# Patient Record
Sex: Male | Born: 1945
Health system: Southern US, Community
[De-identification: ages and names within clinical notes are randomized; demographics above are authoritative.]

## PROBLEM LIST (undated history)

## (undated) DIAGNOSIS — C61 Malignant neoplasm of prostate: Secondary | ICD-10-CM

## (undated) DIAGNOSIS — I10 Essential (primary) hypertension: Secondary | ICD-10-CM

## (undated) HISTORY — DX: Essential (primary) hypertension: I10

## (undated) HISTORY — PX: PROSTATE BIOPSY: SHX241

---

## 1997-11-20 ENCOUNTER — Encounter: Admission: RE | Admit: 1997-11-20 | Discharge: 1997-11-20 | Payer: Self-pay | Admitting: Family Medicine

## 1998-02-17 ENCOUNTER — Encounter: Admission: RE | Admit: 1998-02-17 | Discharge: 1998-02-17 | Payer: Self-pay | Admitting: Family Medicine

## 1998-03-09 ENCOUNTER — Encounter: Admission: RE | Admit: 1998-03-09 | Discharge: 1998-03-09 | Payer: Self-pay | Admitting: Sports Medicine

## 1999-01-10 ENCOUNTER — Encounter: Admission: RE | Admit: 1999-01-10 | Discharge: 1999-01-10 | Payer: Self-pay | Admitting: Sports Medicine

## 1999-01-10 ENCOUNTER — Encounter: Payer: Self-pay | Admitting: Emergency Medicine

## 1999-01-10 ENCOUNTER — Emergency Department (HOSPITAL_COMMUNITY): Admission: EM | Admit: 1999-01-10 | Discharge: 1999-01-10 | Payer: Self-pay | Admitting: Emergency Medicine

## 1999-01-14 ENCOUNTER — Encounter: Admission: RE | Admit: 1999-01-14 | Discharge: 1999-01-14 | Payer: Self-pay | Admitting: Family Medicine

## 1999-01-14 ENCOUNTER — Ambulatory Visit (HOSPITAL_COMMUNITY): Admission: RE | Admit: 1999-01-14 | Discharge: 1999-01-14 | Payer: Self-pay | Admitting: Family Medicine

## 1999-01-20 ENCOUNTER — Ambulatory Visit (HOSPITAL_BASED_OUTPATIENT_CLINIC_OR_DEPARTMENT_OTHER): Admission: RE | Admit: 1999-01-20 | Discharge: 1999-01-20 | Payer: Self-pay | Admitting: Orthopedic Surgery

## 1999-05-19 ENCOUNTER — Encounter: Admission: RE | Admit: 1999-05-19 | Discharge: 1999-05-19 | Payer: Self-pay | Admitting: Sports Medicine

## 1999-05-30 ENCOUNTER — Encounter: Admission: RE | Admit: 1999-05-30 | Discharge: 1999-05-30 | Payer: Self-pay | Admitting: Sports Medicine

## 1999-11-08 ENCOUNTER — Encounter: Admission: RE | Admit: 1999-11-08 | Discharge: 1999-11-08 | Payer: Self-pay | Admitting: Family Medicine

## 1999-12-15 ENCOUNTER — Encounter: Admission: RE | Admit: 1999-12-15 | Discharge: 1999-12-15 | Payer: Self-pay | Admitting: Sports Medicine

## 2000-08-28 ENCOUNTER — Encounter: Admission: RE | Admit: 2000-08-28 | Discharge: 2000-08-28 | Payer: Self-pay | Admitting: Sports Medicine

## 2000-09-06 ENCOUNTER — Encounter: Admission: RE | Admit: 2000-09-06 | Discharge: 2000-09-06 | Payer: Self-pay | Admitting: Family Medicine

## 2001-01-30 ENCOUNTER — Encounter: Admission: RE | Admit: 2001-01-30 | Discharge: 2001-01-30 | Payer: Self-pay | Admitting: Family Medicine

## 2001-02-12 ENCOUNTER — Encounter: Admission: RE | Admit: 2001-02-12 | Discharge: 2001-02-12 | Payer: Self-pay | Admitting: Family Medicine

## 2001-07-05 ENCOUNTER — Encounter: Admission: RE | Admit: 2001-07-05 | Discharge: 2001-07-05 | Payer: Self-pay | Admitting: Family Medicine

## 2001-07-25 ENCOUNTER — Encounter: Admission: RE | Admit: 2001-07-25 | Discharge: 2001-07-25 | Payer: Self-pay | Admitting: Sports Medicine

## 2002-08-19 ENCOUNTER — Encounter: Admission: RE | Admit: 2002-08-19 | Discharge: 2002-08-19 | Payer: Self-pay | Admitting: Family Medicine

## 2002-10-21 ENCOUNTER — Encounter: Admission: RE | Admit: 2002-10-21 | Discharge: 2002-10-21 | Payer: Self-pay | Admitting: Family Medicine

## 2002-10-23 ENCOUNTER — Encounter: Admission: RE | Admit: 2002-10-23 | Discharge: 2002-10-23 | Payer: Self-pay | Admitting: Family Medicine

## 2003-02-12 ENCOUNTER — Encounter: Admission: RE | Admit: 2003-02-12 | Discharge: 2003-02-12 | Payer: Self-pay | Admitting: Sports Medicine

## 2003-04-09 ENCOUNTER — Encounter: Admission: RE | Admit: 2003-04-09 | Discharge: 2003-04-09 | Payer: Self-pay | Admitting: Family Medicine

## 2003-04-14 ENCOUNTER — Encounter: Admission: RE | Admit: 2003-04-14 | Discharge: 2003-04-14 | Payer: Self-pay | Admitting: Sports Medicine

## 2003-07-23 ENCOUNTER — Encounter: Admission: RE | Admit: 2003-07-23 | Discharge: 2003-07-23 | Payer: Self-pay | Admitting: Family Medicine

## 2003-09-02 ENCOUNTER — Encounter: Admission: RE | Admit: 2003-09-02 | Discharge: 2003-09-02 | Payer: Self-pay | Admitting: Sports Medicine

## 2004-10-11 ENCOUNTER — Ambulatory Visit: Payer: Self-pay | Admitting: Family Medicine

## 2004-10-13 ENCOUNTER — Ambulatory Visit: Payer: Self-pay | Admitting: Sports Medicine

## 2004-11-04 ENCOUNTER — Ambulatory Visit (HOSPITAL_COMMUNITY): Admission: RE | Admit: 2004-11-04 | Discharge: 2004-11-04 | Payer: Self-pay | Admitting: Sports Medicine

## 2004-11-04 ENCOUNTER — Ambulatory Visit: Payer: Self-pay | Admitting: Sports Medicine

## 2004-11-18 ENCOUNTER — Ambulatory Visit (HOSPITAL_COMMUNITY): Admission: RE | Admit: 2004-11-18 | Discharge: 2004-11-18 | Payer: Self-pay | Admitting: Gastroenterology

## 2005-02-23 ENCOUNTER — Ambulatory Visit: Payer: Self-pay | Admitting: Family Medicine

## 2005-06-14 ENCOUNTER — Ambulatory Visit: Payer: Self-pay | Admitting: Family Medicine

## 2005-06-15 ENCOUNTER — Ambulatory Visit: Payer: Self-pay | Admitting: Family Medicine

## 2005-12-14 ENCOUNTER — Ambulatory Visit: Payer: Self-pay | Admitting: Sports Medicine

## 2006-10-11 DIAGNOSIS — K5732 Diverticulitis of large intestine without perforation or abscess without bleeding: Secondary | ICD-10-CM

## 2006-10-11 DIAGNOSIS — C4490 Unspecified malignant neoplasm of skin, unspecified: Secondary | ICD-10-CM

## 2006-10-11 DIAGNOSIS — E785 Hyperlipidemia, unspecified: Secondary | ICD-10-CM

## 2006-10-11 DIAGNOSIS — I10 Essential (primary) hypertension: Secondary | ICD-10-CM | POA: Insufficient documentation

## 2006-11-19 ENCOUNTER — Telehealth: Payer: Self-pay | Admitting: Sports Medicine

## 2006-11-20 ENCOUNTER — Encounter (INDEPENDENT_AMBULATORY_CARE_PROVIDER_SITE_OTHER): Payer: Self-pay | Admitting: *Deleted

## 2006-11-22 ENCOUNTER — Ambulatory Visit: Payer: Self-pay | Admitting: Sports Medicine

## 2006-11-22 LAB — CONVERTED CEMR LAB
ALT: 26 units/L (ref 0–53)
AST: 33 units/L (ref 0–37)
Albumin: 4.2 g/dL (ref 3.5–5.2)
Alkaline Phosphatase: 97 units/L (ref 39–117)
Calcium: 9.2 mg/dL (ref 8.4–10.5)
Chloride: 105 meq/L (ref 96–112)
HCT: 40.7 %
HDL: 47 mg/dL (ref >39)
LDL Cholesterol: 100 mg/dL — ABNORMAL HIGH (ref 0–99)
PSA: 1.42 ng/mL (ref 0.10–4.00)
Potassium: 4.1 meq/L (ref 3.5–5.3)
RBC: 4.47 M/uL
Sodium: 142 meq/L (ref 135–145)
Total Protein: 6.9 g/dL (ref 6.0–8.3)

## 2006-12-13 ENCOUNTER — Ambulatory Visit: Payer: Self-pay | Admitting: Sports Medicine

## 2007-02-18 ENCOUNTER — Telehealth: Payer: Self-pay | Admitting: *Deleted

## 2007-11-26 ENCOUNTER — Telehealth: Payer: Self-pay | Admitting: Sports Medicine

## 2007-12-16 ENCOUNTER — Ambulatory Visit: Payer: Self-pay | Admitting: Sports Medicine

## 2007-12-16 LAB — CONVERTED CEMR LAB
AST: 36 units/L (ref 0–37)
Albumin: 4.2 g/dL (ref 3.5–5.2)
BUN: 17 mg/dL (ref 6–23)
CO2: 23 meq/L (ref 19–32)
Calcium: 9 mg/dL (ref 8.4–10.5)
Chloride: 105 meq/L (ref 96–112)
Cholesterol: 200 mg/dL (ref 0–200)
HCT: 41.8 % (ref 39.0–52.0)
HDL: 53 mg/dL (ref >39)
Hemoglobin: 14.4 g/dL (ref 13.0–17.0)
PSA: 1.58 ng/mL (ref 0.10–4.00)
Potassium: 4.5 meq/L (ref 3.5–5.3)
RBC: 4.69 M/uL (ref 4.22–5.81)
RDW: 13.6 % (ref 11.5–15.5)
WBC: 6.6 10*3/uL (ref 4.0–10.5)

## 2007-12-19 ENCOUNTER — Ambulatory Visit: Payer: Self-pay | Admitting: Sports Medicine

## 2008-05-29 ENCOUNTER — Encounter (INDEPENDENT_AMBULATORY_CARE_PROVIDER_SITE_OTHER): Payer: Self-pay | Admitting: *Deleted

## 2008-06-02 ENCOUNTER — Ambulatory Visit: Payer: Self-pay | Admitting: Family Medicine

## 2008-06-02 ENCOUNTER — Encounter: Payer: Self-pay | Admitting: Sports Medicine

## 2008-06-02 LAB — CONVERTED CEMR LAB
Blood in Urine, dipstick: NEGATIVE
Glucose, Urine, Semiquant: NEGATIVE
Ketones, urine, test strip: NEGATIVE
Nitrite: NEGATIVE
Protein, U semiquant: NEGATIVE
Specific Gravity, Urine: <1.005
pH: 5.5

## 2008-06-03 LAB — CONVERTED CEMR LAB
ALT: 29 units/L (ref 0–53)
AST: 31 units/L (ref 0–37)
Albumin: 4.4 g/dL (ref 3.5–5.2)
Calcium: 9.1 mg/dL (ref 8.4–10.5)
Chloride: 106 meq/L (ref 96–112)
PSA: 2.42 ng/mL (ref 0.10–4.00)
Potassium: 4.2 meq/L (ref 3.5–5.3)

## 2008-06-05 ENCOUNTER — Ambulatory Visit: Payer: Self-pay | Admitting: Sports Medicine

## 2008-06-05 DIAGNOSIS — M25569 Pain in unspecified knee: Secondary | ICD-10-CM | POA: Insufficient documentation

## 2008-06-05 DIAGNOSIS — N402 Nodular prostate without lower urinary tract symptoms: Secondary | ICD-10-CM

## 2008-06-25 ENCOUNTER — Encounter: Payer: Self-pay | Admitting: Sports Medicine

## 2008-11-13 ENCOUNTER — Emergency Department (HOSPITAL_COMMUNITY): Admission: EM | Admit: 2008-11-13 | Discharge: 2008-11-13 | Payer: Self-pay | Admitting: Emergency Medicine

## 2008-11-17 ENCOUNTER — Ambulatory Visit: Payer: Self-pay | Admitting: Sports Medicine

## 2008-11-17 DIAGNOSIS — IMO0002 Reserved for concepts with insufficient information to code with codable children: Secondary | ICD-10-CM

## 2008-11-17 DIAGNOSIS — M25519 Pain in unspecified shoulder: Secondary | ICD-10-CM | POA: Insufficient documentation

## 2008-11-17 DIAGNOSIS — T07XXXA Unspecified multiple injuries, initial encounter: Secondary | ICD-10-CM | POA: Insufficient documentation

## 2008-12-08 ENCOUNTER — Ambulatory Visit: Payer: Self-pay | Admitting: Sports Medicine

## 2009-12-14 ENCOUNTER — Encounter: Payer: Self-pay | Admitting: Sports Medicine

## 2010-02-10 ENCOUNTER — Encounter: Payer: Self-pay | Admitting: Family Medicine

## 2010-02-10 ENCOUNTER — Ambulatory Visit: Payer: Self-pay | Admitting: Sports Medicine

## 2010-02-11 LAB — CONVERTED CEMR LAB
ALT: 23 units/L (ref 0–53)
BUN: 16 mg/dL (ref 6–23)
CO2: 25 meq/L (ref 19–32)
Calcium: 8.7 mg/dL (ref 8.4–10.5)
Chloride: 107 meq/L (ref 96–112)
Cholesterol: 180 mg/dL (ref 0–200)
Creatinine, Ser: 1.2 mg/dL (ref 0.40–1.50)
HCT: 39.5 % (ref 39.0–52.0)
Hemoglobin: 13.5 g/dL (ref 13.0–17.0)
RBC: 4.39 M/uL (ref 4.22–5.81)
Total CHOL/HDL Ratio: 4.2
WBC: 5.9 10*3/uL (ref 4.0–10.5)

## 2010-02-24 ENCOUNTER — Ambulatory Visit: Payer: Self-pay | Admitting: Sports Medicine

## 2010-02-24 DIAGNOSIS — M79609 Pain in unspecified limb: Secondary | ICD-10-CM | POA: Insufficient documentation

## 2010-03-12 ENCOUNTER — Emergency Department (HOSPITAL_COMMUNITY): Admission: EM | Admit: 2010-03-12 | Discharge: 2010-03-12 | Payer: Self-pay | Admitting: Emergency Medicine

## 2010-03-13 ENCOUNTER — Ambulatory Visit (HOSPITAL_COMMUNITY): Admission: RE | Admit: 2010-03-13 | Discharge: 2010-03-13 | Payer: Self-pay | Admitting: Orthopedic Surgery

## 2010-09-13 NOTE — Consult Note (Signed)
Summary: Teaugue,Rotenstreich,Stananland,[employer] at law  Teaugue,Rotenstreich,Stananland,[employer] Leonor Liv Attorneys at law   Imported By: Marily Memos 12/20/2009 08:35:25  _____________________________________________________________________  External Attachment:    Type:   Image     Comment:   External Document
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
## 2010-11-23 LAB — URINE MICROSCOPIC-ADD ON

## 2010-11-23 LAB — POCT I-STAT, CHEM 8
BUN: 17 mg/dL (ref 6–23)
Creatinine, Ser: 1.2 mg/dL (ref 0.4–1.5)
Sodium: 141 mEq/L (ref 135–145)
TCO2: 24 mmol/L (ref 0–100)

## 2010-11-23 LAB — URINALYSIS, ROUTINE W REFLEX MICROSCOPIC
Bilirubin Urine: NEGATIVE
Glucose, UA: NEGATIVE mg/dL
Ketones, ur: NEGATIVE mg/dL
Nitrite: NEGATIVE
Protein, ur: NEGATIVE mg/dL
Specific Gravity, Urine: 1.019 (ref 1.005–1.030)
Urobilinogen, UA: 0.2 mg/dL (ref 0.0–1.0)
pH: 6.5 (ref 5.0–8.0)

## 2010-12-15 ENCOUNTER — Other Ambulatory Visit: Payer: Self-pay | Admitting: *Deleted

## 2010-12-15 MED ORDER — NIFEDIPINE 30 MG (OSM) PO TB24: 30.0000 mg | ORAL_TABLET | Freq: Every day | ORAL | Status: DC

## 2010-12-15 MED ORDER — BENAZEPRIL HCL 20 MG PO TABS: 20.0000 mg | ORAL_TABLET | Freq: Every day | ORAL | Status: DC

## 2010-12-30 NOTE — Op Note (Signed)
NAME:  DOYT, CASTELLANA NO.:  [id number]   MEDICAL RECORD NO.:  [id number]          PATIENT TYPE:  AMB   LOCATION:  ENDO                         FACILITY:  [facility]   PHYSICIAN:  Graylin Shiver, M.D.   DATE OF BIRTH:  06-Mar-1946   DATE OF PROCEDURE:  11/18/2004  DATE OF DISCHARGE:                                 OPERATIVE REPORT   PROCEDURE:  Colonoscopy.   INDICATIONS FOR PROCEDURE:  Family history of colon cancer.  Informed  consent was obtained after explanation of the risks of bleeding, infection,  and perforation.   PREMEDICATION:  Fentanyl 100 mcg IV, Versed 10 mg IV.   PROCEDURE IN DETAIL:  With the patient in the left lateral decubitus  position, a rectal exam was performed and no masses were felt.  The Olympus  colonoscope was inserted into the rectum and advanced around the colon to  the cecum.  Cecal landmarks were identified.  The cecum looked normal.  There were a few diverticula noted in the ascending colon and transverse  colon.  The descending colon and sigmoid showed several diverticula.  The  rectum was normal.  He tolerated the procedure well without complications.   IMPRESSION:  Diverticulosis.   RECOMMENDATIONS:  I would recommend a follow up colonoscopy again in five  years due to the family history.      SFG/MEDQ  D:  11/18/2004  T:  11/18/2004  Job:  [id number]   cc:   Janetta Hora. Fields, MD  [address], [place] [address]

## 2012-06-21 ENCOUNTER — Other Ambulatory Visit: Payer: Self-pay

## 2012-11-19 ENCOUNTER — Encounter (INDEPENDENT_AMBULATORY_CARE_PROVIDER_SITE_OTHER): Payer: Medicare PPO | Admitting: *Deleted

## 2012-11-19 ENCOUNTER — Other Ambulatory Visit: Payer: Medicare PPO

## 2012-11-19 ENCOUNTER — Other Ambulatory Visit: Payer: Self-pay | Admitting: Family Medicine

## 2012-11-19 DIAGNOSIS — E785 Hyperlipidemia, unspecified: Secondary | ICD-10-CM

## 2012-11-19 DIAGNOSIS — Z789 Other specified health status: Secondary | ICD-10-CM

## 2012-11-19 DIAGNOSIS — I10 Essential (primary) hypertension: Secondary | ICD-10-CM

## 2012-11-19 DIAGNOSIS — Z125 Encounter for screening for malignant neoplasm of prostate: Secondary | ICD-10-CM

## 2012-11-19 DIAGNOSIS — R5381 Other malaise: Secondary | ICD-10-CM

## 2012-11-19 DIAGNOSIS — R5383 Other fatigue: Secondary | ICD-10-CM

## 2012-11-19 LAB — COMPLETE METABOLIC PANEL WITH GFR
ALT: 23 U/L (ref 0–53)
AST: 30 U/L (ref 0–37)
Albumin: 4.1 g/dL (ref 3.5–5.2)
Alkaline Phosphatase: 94 U/L (ref 39–117)
BUN: 13 mg/dL (ref 6–23)
CO2: 28 mEq/L (ref 19–32)
Calcium: 9.4 mg/dL (ref 8.4–10.5)
Chloride: 107 mEq/L (ref 96–112)
Creat: 1.06 mg/dL (ref 0.50–1.35)
GFR, Est African American: 83 mL/min
GFR, Est Non African American: 72 mL/min
Glucose, Bld: 93 mg/dL (ref 70–99)
Potassium: 4.8 mEq/L (ref 3.5–5.3)
Sodium: 142 mEq/L (ref 135–145)
Total Bilirubin: 1.4 mg/dL — ABNORMAL HIGH (ref 0.3–1.2)
Total Protein: 6.7 g/dL (ref 6.0–8.3)

## 2012-11-19 LAB — POCT CBC
Granulocyte percent: 65.4 %G (ref 37–80)
HCT, POC: 40.8 % — AB (ref 43.5–53.7)
Hemoglobin: 13.7 g/dL — AB (ref 14.1–18.1)
Lymph, poc: 1.6 (ref 0.6–3.4)
MCH, POC: 31.2 pg (ref 27–31.2)
MCHC: 33.6 g/dL (ref 31.8–35.4)
MCV: 92.8 fL (ref 80–97)
MPV: 6.4 fL (ref 0–99.8)
POC Granulocyte: 3.7 (ref 2–6.9)
POC LYMPH PERCENT: 29.2 %L (ref 10–50)
Platelet Count, POC: 161 10*3/uL (ref 142–424)
RBC: 4.4 M/uL — AB (ref 4.69–6.13)
RDW, POC: 13.3 %
WBC: 5.6 10*3/uL (ref 4.6–10.2)

## 2012-11-19 LAB — PSA: PSA: 3.62 ng/mL (ref <=4.00)

## 2012-11-19 LAB — VITAMIN B12: Vitamin B-12: 893 pg/mL (ref 211–911)

## 2012-11-20 LAB — NMR LIPOPROFILE WITH LIPIDS
Cholesterol, Total: 165 mg/dL (ref <200)
HDL Particle Number: 33.6 umol/L (ref >=30.5)
HDL Size: 8.8 nm — ABNORMAL LOW (ref >=9.2)
HDL-C: 47 mg/dL (ref >=40)
LDL (calc): 97 mg/dL (ref <100)
LDL Particle Number: 1349 nmol/L — ABNORMAL HIGH (ref <1000)
LDL Size: 20.7 nm (ref >20.5)
LP-IR Score: 39 (ref <=45)
Large HDL-P: 4.7 umol/L — ABNORMAL LOW (ref >=4.8)
Large VLDL-P: 0.8 nmol/L (ref <=2.7)
Small LDL Particle Number: 818 nmol/L — ABNORMAL HIGH (ref <=527)
Triglycerides: 104 mg/dL (ref <150)
VLDL Size: 44.3 nm (ref <=46.6)

## 2012-11-20 NOTE — Progress Notes (Signed)
Quick Note:  Call patient. Labs normal.await the rest of the labs before calling patient. No change in plan. ______

## 2012-11-21 ENCOUNTER — Telehealth: Payer: Self-pay | Admitting: Family Medicine

## 2012-11-21 NOTE — Telephone Encounter (Signed)
Labs out front

## 2012-12-31 ENCOUNTER — Telehealth: Payer: Self-pay | Admitting: Family Medicine

## 2012-12-31 MED ORDER — BENAZEPRIL HCL 40 MG PO TABS: 40.0000 mg | ORAL_TABLET | Freq: Every day | ORAL | Status: DC

## 2012-12-31 NOTE — Telephone Encounter (Signed)
Known patient at [facility]. Please refillx11 He is coming here soon. Thanks.

## 2012-12-31 NOTE — Telephone Encounter (Signed)
Medication refilled and sent electronically to pharmacy.

## 2013-03-25 ENCOUNTER — Ambulatory Visit (INDEPENDENT_AMBULATORY_CARE_PROVIDER_SITE_OTHER): Payer: Medicare PPO | Admitting: Family Medicine

## 2013-03-25 ENCOUNTER — Encounter: Payer: Self-pay | Admitting: Family Medicine

## 2013-03-25 VITALS — BP 142/80 | HR 47 | Temp 97.1°F | Wt 169.6 lb

## 2013-03-25 DIAGNOSIS — R059 Cough, unspecified: Secondary | ICD-10-CM | POA: Insufficient documentation

## 2013-03-25 DIAGNOSIS — I10 Essential (primary) hypertension: Secondary | ICD-10-CM

## 2013-03-25 DIAGNOSIS — R05 Cough: Secondary | ICD-10-CM | POA: Insufficient documentation

## 2013-03-25 DIAGNOSIS — E785 Hyperlipidemia, unspecified: Secondary | ICD-10-CM

## 2013-03-25 DIAGNOSIS — J310 Chronic rhinitis: Secondary | ICD-10-CM

## 2013-03-25 MED ORDER — FLUTICASONE PROPIONATE 50 MCG/ACT NA SUSP: 2.0000 | Freq: Every day | NASAL | Status: DC

## 2013-03-25 NOTE — Progress Notes (Signed)
Patient ID: Jeremy Caldwell, male   DOB: 12/05/1945, 67 y.o.   MRN: [id number] SUBJECTIVE: CC: Chief Complaint  Patient presents with  . Cough    laryngiits at end of day  cough x 10 days   HPI: Has had a chronic cough for a couple of months. Has had to reduce the benazepril and the nifedipine. Mild  Nasal congestion. No fever, no chills no SOB. Past Medical History  Diagnosis Date  . Hypertension    No past surgical history on file. History   Social History  . Marital Status: Married    Spouse Name: N/A    Number of Children: N/A  . Years of Education: N/A   Occupational History  . Not on file.   Social History Main Topics  . Smoking status: Former Smoker    Quit date: 03/26/1983  . Smokeless tobacco: Not on file  . Alcohol Use: Not on file  . Drug Use: Not on file  . Sexually Active: Not on file   Other Topics Concern  . Not on file   Social History Narrative  . No narrative on file   No family history on file. No current outpatient prescriptions on file prior to visit.   No current facility-administered medications on file prior to visit.   No Known Allergies Immunization History  Administered Date(s) Administered  . Td 08/14/1992, 02/24/2010   Prior to Admission medications   Medication Sig Start Date End Date Taking? Authorizing Provider  fluticasone (FLONASE) 50 MCG/ACT nasal spray Place 2 sprays into the nose daily. 03/25/13   Ileana Ladd, MD  NIFEdipine (PROCARDIA-XL/ADALAT CC) 60 MG 24 hr tablet Take 1 tablet (60 mg total) by mouth daily. 03/25/13   Ileana Ladd, MD    ROS: As above in the HPI. All other systems are stable or negative.  OBJECTIVE: APPEARANCE:  Patient in no acute distress.The patient appeared well nourished and normally developed. Acyanotic. Waist: VITAL SIGNS:BP 142/80  Pulse 47  Temp(Src) 97.1 F (36.2 C) (Oral)  Wt 169 lb 9.6 oz (76.93 kg)  BMI 25.03 kg/m2 WM  SKIN: warm and  Dry without overt rashes, tattoos and  scars  HEAD and Neck: without JVD, Head and scalp: normal Eyes:No scleral icterus. Fundi normal, eye movements normal. Ears: Auricle normal, canal normal, Tympanic membranes normal, insufflation normal. Nose: mild nasal congestion Throat: normal Neck & thyroid: normal  CHEST & LUNGS: Chest wall: normal Lungs: Clear. hacky cough  CVS: Reveals the PMI to be normally located. Regular rhythm, First and Second Heart sounds are normal,  absence of murmurs, rubs or gallops. Peripheral vasculature: Radial pulses: normal Dorsal pedis pulses: normal Posterior pulses: normal  ABDOMEN:  Appearance: normal Benign, no organomegaly, no masses, no Abdominal Aortic enlargement. No Guarding , no rebound. No Bruits. Bowel sounds: normal  EXTREMETIES: nonedematous.  MUSCULOSKELETAL:  Spine: normal Joints: intact  NEUROLOGIC: oriented to time,place and person; nonfocal. Results for orders placed in visit on 11/19/12  COMPLETE METABOLIC PANEL WITH GFR      Result Value Range   Sodium 142  135 - 145 mEq/L   Potassium 4.8  3.5 - 5.3 mEq/L   Chloride 107  96 - 112 mEq/L   CO2 28  19 - 32 mEq/L   Glucose, Bld 93  70 - 99 mg/dL   BUN 13  6 - 23 mg/dL   Creat 4.09  8.11 - 9.14 mg/dL   Total Bilirubin 1.4 (*) 0.3 - 1.2 mg/dL   Alkaline  Phosphatase 94  39 - 117 U/L   AST 30  0 - 37 U/L   ALT 23  0 - 53 U/L   Total Protein 6.7  6.0 - 8.3 g/dL   Albumin 4.1  3.5 - 5.2 g/dL   Calcium 9.4  8.4 - 11.9 mg/dL   GFR, Est African American 83     GFR, Est Non African American 72    NMR LIPOPROFILE WITH LIPIDS      Result Value Range   LDL Particle Number 1349 (*) <1000 nmol/L   LDL (calc) 97  <100 mg/dL   HDL-C 47  >=14 mg/dL   Triglycerides 782  <956 mg/dL   Cholesterol, Total 213  <200 mg/dL   HDL Particle Number 08.6  >=57.8 umol/L   Large HDL-P 4.7 (*) >=4.8 umol/L   Large VLDL-P 0.8  <=2.7 nmol/L   Small LDL Particle Number 818 (*) <=527 nmol/L   LDL Size 20.7  >20.5 nm   HDL Size 8.8  (*) >=9.2 nm   VLDL Size 44.3  <=46.6 nm   LP-IR Score 39  <=45  PSA      Result Value Range   PSA 3.62  <=4.00 ng/mL  VITAMIN B12      Result Value Range   Vitamin B-12 893  211 - 911 pg/mL  POCT CBC      Result Value Range   WBC 5.6  4.6 - 10.2 K/uL   Lymph, poc 1.6  0.6 - 3.4   POC LYMPH PERCENT 29.2  10 - 50 %L   POC Granulocyte 3.7  2 - 6.9   Granulocyte percent 65.4  37 - 80 %G   RBC 4.4 (*) 4.69 - 6.13 M/uL   Hemoglobin 13.7 (*) 14.1 - 18.1 g/dL   HCT, POC 46.9 (*) 62.9 - 53.7 %   MCV 92.8  80 - 97 fL   MCH, POC 31.2  27 - 31.2 pg   MCHC 33.6  31.8 - 35.4 g/dL   RDW, POC 52.8     Platelet Count, POC 161.0  142 - 424 K/uL   MPV 6.4  0 - 99.8 fL    ASSESSMENT: Cough  HTN (hypertension) - Plan: NIFEdipine (PROCARDIA-XL/ADALAT CC) 60 MG 24 hr tablet  Rhinitis - Plan: fluticasone (FLONASE) 50 MCG/ACT nasal spray  HYPERLIPIDEMIA  PLAN: Meds ordered this encounter  Medications  . NIFEdipine (PROCARDIA-XL/ADALAT CC) 60 MG 24 hr tablet    Sig: Take 1 tablet (60 mg total) by mouth daily.  . fluticasone (FLONASE) 50 MCG/ACT nasal spray    Sig: Place 2 sprays into the nose daily.    Dispense:  16 g    Refill:  6   Medications Discontinued During This Encounter  Medication Reason  . benazepril (LOTENSIN) 40 MG tablet Side effect (s)  . NIFEdipine (PROCARDIA XL) 30 MG (OSM) 24 hr tablet Dose change   Return if symptoms worsen or fail to improve.  Ethanjames Fontenot P. Modesto Charon, M.D.

## 2013-05-12 ENCOUNTER — Other Ambulatory Visit: Payer: Self-pay | Admitting: Family Medicine

## 2013-05-12 DIAGNOSIS — I10 Essential (primary) hypertension: Secondary | ICD-10-CM

## 2013-05-12 DIAGNOSIS — E785 Hyperlipidemia, unspecified: Secondary | ICD-10-CM

## 2013-05-19 ENCOUNTER — Other Ambulatory Visit: Payer: Self-pay | Admitting: Family Medicine

## 2013-05-20 ENCOUNTER — Other Ambulatory Visit (INDEPENDENT_AMBULATORY_CARE_PROVIDER_SITE_OTHER): Payer: Medicare PPO

## 2013-05-20 DIAGNOSIS — E785 Hyperlipidemia, unspecified: Secondary | ICD-10-CM

## 2013-05-20 DIAGNOSIS — I10 Essential (primary) hypertension: Secondary | ICD-10-CM

## 2013-05-20 NOTE — Telephone Encounter (Signed)
rx called to [facility] and left on vocie mail

## 2013-05-20 NOTE — Telephone Encounter (Signed)
Last seen 03/25/13  FPW

## 2013-05-20 NOTE — Telephone Encounter (Signed)
Prescription renewed in EPIC. 

## 2013-05-22 LAB — CMP14+EGFR
ALT: 23 IU/L (ref 0–44)
AST: 35 IU/L (ref 0–40)
Albumin/Globulin Ratio: 1.7 (ref 1.1–2.5)
Albumin: 4.4 g/dL (ref 3.6–4.8)
Alkaline Phosphatase: 116 IU/L (ref 39–117)
BUN/Creatinine Ratio: 10 (ref 10–22)
BUN: 11 mg/dL (ref 8–27)
CO2: 27 mmol/L (ref 18–29)
Calcium: 9.4 mg/dL (ref 8.6–10.2)
Chloride: 102 mmol/L (ref 97–108)
Creatinine, Ser: 1.08 mg/dL (ref 0.76–1.27)
GFR calc Af Amer: 81 mL/min/{1.73_m2} (ref >59)
GFR calc non Af Amer: 70 mL/min/{1.73_m2} (ref >59)
Globulin, Total: 2.6 g/dL (ref 1.5–4.5)
Glucose: 90 mg/dL (ref 65–99)
Potassium: 4.9 mmol/L (ref 3.5–5.2)
Sodium: 142 mmol/L (ref 134–144)
Total Bilirubin: 1.5 mg/dL — ABNORMAL HIGH (ref 0.0–1.2)
Total Protein: 7 g/dL (ref 6.0–8.5)

## 2013-05-22 LAB — NMR, LIPOPROFILE
Cholesterol: 180 mg/dL (ref <200)
HDL Cholesterol by NMR: 53 mg/dL (ref >=40)
HDL Particle Number: 37.1 umol/L (ref >=30.5)
LDL Particle Number: 1359 nmol/L — ABNORMAL HIGH (ref <1000)
LDL Size: 21 nm (ref >20.5)
LDLC SERPL CALC-MCNC: 105 mg/dL — ABNORMAL HIGH (ref <100)
LP-IR Score: 40 (ref <=45)
Small LDL Particle Number: 624 nmol/L — ABNORMAL HIGH (ref <=527)
Triglycerides by NMR: 111 mg/dL (ref <150)

## 2013-06-19 ENCOUNTER — Other Ambulatory Visit: Payer: Self-pay

## 2013-10-23 ENCOUNTER — Other Ambulatory Visit (INDEPENDENT_AMBULATORY_CARE_PROVIDER_SITE_OTHER): Payer: Medicare PPO

## 2013-10-23 ENCOUNTER — Other Ambulatory Visit: Payer: Self-pay | Admitting: Family Medicine

## 2013-10-23 DIAGNOSIS — E785 Hyperlipidemia, unspecified: Secondary | ICD-10-CM

## 2013-10-23 DIAGNOSIS — K5732 Diverticulitis of large intestine without perforation or abscess without bleeding: Secondary | ICD-10-CM

## 2013-10-23 DIAGNOSIS — C4490 Unspecified malignant neoplasm of skin, unspecified: Secondary | ICD-10-CM

## 2013-10-23 DIAGNOSIS — I10 Essential (primary) hypertension: Secondary | ICD-10-CM

## 2013-10-23 DIAGNOSIS — Z789 Other specified health status: Secondary | ICD-10-CM

## 2013-10-23 DIAGNOSIS — N402 Nodular prostate without lower urinary tract symptoms: Secondary | ICD-10-CM

## 2013-10-23 LAB — POCT CBC
Granulocyte percent: 69.8 %G (ref 37–80)
HCT, POC: 44.1 % (ref 43.5–53.7)
Hemoglobin: 14.5 g/dL (ref 14.1–18.1)
Lymph, poc: 1.7 (ref 0.6–3.4)
MCH, POC: 30.2 pg (ref 27–31.2)
MCHC: 32.8 g/dL (ref 31.8–35.4)
MCV: 91.9 fL (ref 80–97)
MPV: 7.8 fL (ref 0–99.8)
POC Granulocyte: 4.6 (ref 2–6.9)
POC LYMPH PERCENT: 25.7 %L (ref 10–50)
Platelet Count, POC: 186 10*3/uL (ref 142–424)
RBC: 4.8 M/uL (ref 4.69–6.13)
RDW, POC: 13.8 %
WBC: 6.5 10*3/uL (ref 4.6–10.2)

## 2013-10-23 NOTE — Progress Notes (Signed)
Pt here for lab only 

## 2013-10-24 LAB — CMP14+EGFR
ALT: 26 IU/L (ref 0–44)
AST: 32 IU/L (ref 0–40)
Albumin/Globulin Ratio: 1.8 (ref 1.1–2.5)
Albumin: 4.5 g/dL (ref 3.6–4.8)
Alkaline Phosphatase: 115 IU/L (ref 39–117)
BUN/Creatinine Ratio: 18 (ref 10–22)
BUN: 19 mg/dL (ref 8–27)
CO2: 24 mmol/L (ref 18–29)
Calcium: 9.5 mg/dL (ref 8.6–10.2)
Chloride: 103 mmol/L (ref 97–108)
Creatinine, Ser: 1.05 mg/dL (ref 0.76–1.27)
GFR calc Af Amer: 83 mL/min/{1.73_m2} (ref >59)
GFR calc non Af Amer: 72 mL/min/{1.73_m2} (ref >59)
Globulin, Total: 2.5 g/dL (ref 1.5–4.5)
Glucose: 94 mg/dL (ref 65–99)
Potassium: 4.8 mmol/L (ref 3.5–5.2)
Sodium: 144 mmol/L (ref 134–144)
Total Bilirubin: 1.3 mg/dL — ABNORMAL HIGH (ref 0.0–1.2)
Total Protein: 7 g/dL (ref 6.0–8.5)

## 2013-10-24 LAB — NMR, LIPOPROFILE
Cholesterol: 191 mg/dL (ref <200)
HDL Cholesterol by NMR: 54 mg/dL (ref >=40)
HDL Particle Number: 35.5 umol/L (ref >=30.5)
LDL Particle Number: 1287 nmol/L — ABNORMAL HIGH (ref <1000)
LDL Size: 21.4 nm (ref >20.5)
LDLC SERPL CALC-MCNC: 117 mg/dL — ABNORMAL HIGH (ref <100)
LP-IR Score: 25 (ref <=45)
Small LDL Particle Number: 517 nmol/L (ref <=527)
Triglycerides by NMR: 98 mg/dL (ref <150)

## 2013-10-24 LAB — PSA, TOTAL AND FREE
PSA, Free Pct: 8.4 %
PSA, Free: 0.37 ng/mL
PSA: 4.4 ng/mL — ABNORMAL HIGH (ref 0.0–4.0)

## 2013-10-24 LAB — VITAMIN B12: Vitamin B-12: >1999 pg/mL — ABNORMAL HIGH (ref 211–946)

## 2014-05-29 ENCOUNTER — Other Ambulatory Visit: Payer: Self-pay

## 2015-01-06 ENCOUNTER — Other Ambulatory Visit: Payer: Self-pay | Admitting: Family Medicine

## 2015-01-06 DIAGNOSIS — R1012 Left upper quadrant pain: Secondary | ICD-10-CM

## 2015-01-08 ENCOUNTER — Ambulatory Visit
Admission: RE | Admit: 2015-01-08 | Discharge: 2015-01-08 | Disposition: A | Discharge status: Home / Self Care | Payer: Medicare PPO | Source: Ambulatory Visit | Attending: Family Medicine | Admitting: Family Medicine

## 2015-01-08 DIAGNOSIS — R1012 Left upper quadrant pain: Secondary | ICD-10-CM

## 2015-02-08 ENCOUNTER — Other Ambulatory Visit: Payer: Self-pay

## 2018-01-21 ENCOUNTER — Other Ambulatory Visit: Payer: Self-pay | Admitting: Urology

## 2018-01-21 DIAGNOSIS — C61 Malignant neoplasm of prostate: Secondary | ICD-10-CM

## 2018-02-27 ENCOUNTER — Ambulatory Visit
Admission: RE | Admit: 2018-02-27 | Discharge: 2018-02-27 | Disposition: A | Discharge status: Home / Self Care | Payer: Medicare Other | Source: Ambulatory Visit | Attending: Urology | Admitting: Urology

## 2018-02-27 DIAGNOSIS — C61 Malignant neoplasm of prostate: Secondary | ICD-10-CM

## 2018-02-27 MED ORDER — GADOBENATE DIMEGLUMINE 529 MG/ML IV SOLN
15.0000 mL | Freq: Once | INTRAVENOUS | Status: AC | PRN
  Administered 2018-02-27: 15 mL via INTRAVENOUS

## 2018-12-11 ENCOUNTER — Other Ambulatory Visit: Payer: Self-pay | Admitting: Urology

## 2018-12-11 DIAGNOSIS — C61 Malignant neoplasm of prostate: Secondary | ICD-10-CM

## 2018-12-24 ENCOUNTER — Ambulatory Visit
Admission: RE | Admit: 2018-12-24 | Discharge: 2018-12-24 | Disposition: A | Discharge status: Home / Self Care | Payer: Medicare Other | Source: Ambulatory Visit | Attending: Urology | Admitting: Urology

## 2018-12-24 ENCOUNTER — Other Ambulatory Visit: Payer: Self-pay

## 2018-12-24 DIAGNOSIS — C61 Malignant neoplasm of prostate: Secondary | ICD-10-CM

## 2018-12-24 MED ORDER — GADOBENATE DIMEGLUMINE 529 MG/ML IV SOLN
15.0000 mL | Freq: Once | INTRAVENOUS | Status: AC | PRN
  Administered 2018-12-24: 15 mL via INTRAVENOUS

## 2019-01-13 ENCOUNTER — Encounter: Payer: Self-pay | Admitting: Radiation Oncology

## 2019-01-13 NOTE — Progress Notes (Signed)
GU Location of Tumor / Histology: prostatic adenocarcinoma  If Prostate Cancer, Gleason Score is (3 + 3) and PSA is (4.18) on 04/2013. Per MRI done 12/24/18 prostate volume: 35cm^3.   Jeremy Caldwell was initially diagnosed with prostate ca in 2014. A repeat biopsy was performed in 2015 which revealed progression in cores involved.   11/2018   PSA  7.51 08/2018   PSA  6.52 01/2018   PSA  5.23 2015 until 2018 PSA  4-5  Biopsies of prostate (if applicable) revealed:   Past/Anticipated interventions by urology, if any: prostate biopsy, repeat prostate biopsy, MRI, referral for consideration of ERBT vs. brachy  Past/Anticipated interventions by medical oncology, if any: no  Weight changes, if any: no  Bowel/Bladder complaints, if any: IPSS 1. SHIM 3. Denies urinary leakage or incontinence.Reports occasional urgency and frequency. Reports normal FOS. Denies having the sensation of not emptying his bladder completely. Reports nocturia x 1. Denies dysuria or hematuria.    Nausea/Vomiting, if any: no  Pain issues, if any:  denies  SAFETY ISSUES:  Prior radiation? 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 Denies receiving any type of daily radiation treatment.   Pacemaker/ICD? denies  Possible current pregnancy? no, male patient  Is the patient on methotrexate? Denies   Current Complaints / other details:  73 year old male. NKDA. Retired [employer]. Enjoys running 3-5 miles per day, lifting weighs, and doing yoga. Reports mother and father both died of an unknown type of cancer.

## 2019-01-14 ENCOUNTER — Ambulatory Visit
Admission: RE | Admit: 2019-01-14 | Discharge: 2019-01-14 | Disposition: A | Discharge status: Home / Self Care | Payer: Medicare Other | Source: Ambulatory Visit | Attending: Radiation Oncology | Admitting: Radiation Oncology

## 2019-01-14 ENCOUNTER — Ambulatory Visit: Payer: Medicare Other | Admitting: Radiation Oncology

## 2019-01-14 ENCOUNTER — Other Ambulatory Visit: Payer: Self-pay

## 2019-01-14 ENCOUNTER — Encounter: Payer: Self-pay | Admitting: Radiation Oncology

## 2019-01-14 VITALS — Ht 68.5 in | Wt 170.0 lb

## 2019-01-14 DIAGNOSIS — C61 Malignant neoplasm of prostate: Secondary | ICD-10-CM

## 2019-01-14 HISTORY — DX: Malignant neoplasm of prostate: C61

## 2019-01-14 NOTE — Progress Notes (Signed)
Radiation Oncology         [phone] ________________________________  Initial Outpatient Consultation - Conducted via WebEx due to current COVID-19 concerns for limiting patient exposure  Name: Jeremy Caldwell MRN: [id number]  Date: 01/14/2019  DOB: 1945/12/28  KG:YJEH, Edwyna Shell, MD  Davis Gourd*   REFERRING PHYSICIAN: Davis Gourd*  DIAGNOSIS: 73 y.o. gentleman with Stage T1c adenocarcinoma of the prostate with Gleason score of 3+3, and PSA of 7.51, pending repeat biopsy    ICD-10-CM   1. Malignant neoplasm of prostate ([place]) C61     HISTORY OF PRESENT ILLNESS: Jeremy Caldwell is a 73 y.o. male with a diagnosis of prostate cancer. He was initially diagnosed with T1c, Gleason 3+3 prostate cancer in 2/12 cores on 04/17/2013 prostate biopsy; PSA at time of diagnosis was 4.8. He elected to proceed with active surveillance and had a repeat transrectal ultrasound with 12 biopsies of the prostate on 01/22/2014.  The prostate volume measured 35 cc. Fortunately, this biopsy reaveled stable, Gleason 3+3 disease in 4 of the 12 core samples.  He continued in close follow up for AS and his PSA had remained stable in the 4-5 range since his last biopsy in 2015.   He established care with Dr. Lovena Neighbours (previously followed by Dr. Risa Grill) in June 2019. An MRI of the prostate in July 2019 identified two adjacent lesions spanning the left lateral base and left lateral mid gland (PI-RADS 4). He was encouraged to proceed with repeat surveillance biopsy at that time but elected instead to continue to monitor his PSA and postpone repeat biopsy.  A PSA in January 2020 was further elevated at 6.52 and increased further to 7.51 in April 2020. Repeat prostate MRI on 12/24/2018 showed a suspicious lesion in the left lateral mid gland, increased in size from 11 x 6 mm on July scan to now 16 x 8 mm (PI-RADS 4). The prostate volume measured 35 cc.  There was no evidence of extracapsular spread, seminal vesicle  involvement or lymphadenopathy in the abdomen or pelvis.  PSA History 11/2018: PSA 7.51 08/2018: PSA 6.52 01/2018: PSA 5.23 06/2017: PSA 5.29 12/2016: PSA 5.10 04/2015: PSA 5.19 09/2014: PSA 4.60 05/2014: PSA 4.12 01/2014: PSA 5.05 09/2013: PSA 4.18  Despite extensive counseling with his urologist, Dr. Lovena Neighbours, the patient is very reluctant to have another surveillance prostate biopsy performed and feels it would be more prudent to proceed straight to treatment of his prostate cancer. Therefore, he has kindly been referred today for discussion of potential radiation treatment options.  PREVIOUS RADIATION THERAPY: Patient reports being exposed to "nuclear stuff" while serving in [employer] and during employment as a Land but denies receiving any type of prior radiation treatment.   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   Food insecurity:    Worry: Not on file    Inability: Not on file   Transportation needs:    Medical: Not on file    Non-medical: Not on file  Tobacco Use   Smoking status: Former Smoker    Packs/day: 1.00    Years: 15.00    Pack years: 15.00    Types: Cigarettes    Last attempt to quit: 08/14/1976    Years since quitting: 42.4   Smokeless tobacco: Never Used  Substance and Sexual  Activity   Alcohol use: Not Currently   Drug use: Never   Sexual activity: Not Currently  Lifestyle   Physical activity:    Days per week: Not on file    Minutes per session: Not on file   Stress: Not on file  Relationships   Social connections:    Talks on phone: Not on file    Gets together: Not on file    Attends religious service: Not on file    Active member of club or organization: Not on file    Attends meetings of clubs or organizations: Not on file    Relationship status: Not on file   Intimate partner violence:    Fear of current or ex partner: Not on file    Emotionally abused: Not on file    Physically abused: Not on file    Forced sexual activity: Not on file  Other Topics Concern   Not on file  Social History Narrative   Married with two children (1 son, 1 daughter) and four grandchildren. Enjoys running 3-5 miles per day, lifting weights, and doing yoga.    ALLERGIES: Patient has no known allergies.  MEDICATIONS:  Current Outpatient Medications  Medication Sig Dispense Refill   fluticasone (FLONASE) 50 MCG/ACT nasal spray Place 2 sprays into the nose daily. 16 g 6   Multiple Vitamin (MULTIVITAMIN) tablet Take 1 tablet by mouth daily.     Multiple Vitamins-Minerals (OCUVITE PRESERVISION PO) Take by mouth.     NIFEdipine (PROCARDIA-XL/ADALAT CC) 30 MG 24 hr tablet TAKE 1 TABLET BY MOUTH DAILY 90 tablet 3   No current facility-administered medications for this encounter.     REVIEW OF SYSTEMS:  On review of systems, the patient reports that he is doing well overall. He denies any chest pain, shortness of breath, cough, fevers, chills, night sweats, or unintended weight changes. He denies any bowel disturbances, and denies abdominal pain, nausea or vomiting. He denies any new musculoskeletal or joint aches or pains. His IPSS was 1, indicating mild urinary symptoms with occasional urgency and frequency and nocturia x1. He denies dysuria, hematuria,  leakage or incontinence. His SHIM was 3, indicating he does have severe erectile dysfunction. A complete review of systems is obtained and is otherwise negative.   PHYSICAL EXAM:  Wt Readings from Last 3 Encounters:  01/14/19 170 lb (77.1 kg)  03/25/13 169 lb 9.6 oz (76.9 kg)   Temp Readings from Last 3 Encounters:  03/25/13 97.1 F (36.2 C) (Oral)   BP Readings from Last 3 Encounters:  03/25/13 (!) 142/80   Pulse Readings from Last 3 Encounters:  03/25/13 (!) 47   Pain Assessment Pain Score: 0-No pain/10  In general this is a well appearing caucasian male in no acute distress. He is alert and oriented x4 and appropriate throughout the examination. Cardiopulmonary assessment  is negative for acute distress and he exhibits normal effort. Remainder of exam not performed in light of virtual consultation platform.   KPS = 100  100 - Normal; no complaints; no evidence of disease. 90   - Able to carry on normal activity; minor signs or symptoms of disease. 80   - Normal activity with effort; some signs or symptoms of disease. 32   - Cares for self; unable to carry on normal activity or to do active work. 60   - Requires occasional assistance, but is able to care for most of his personal needs. 50   - Requires considerable assistance and frequent medical care. 61   - Disabled; requires special care and assistance. 91   - Severely disabled; hospital admission is indicated although death not imminent. 45   - Very sick; hospital admission necessary; active supportive treatment necessary. 10   - Moribund; fatal processes progressing rapidly. 0     - Dead  Karnofsky DA, Abelmann Dodge, Craver LS and Burchenal [facility] [phone]) The use of the nitrogen mustards in the palliative treatment of carcinoma: with particular reference to bronchogenic carcinoma Cancer 1 634-56  LABORATORY DATA:  Lab Results  Component Value Date   WBC 6.5 10/23/2013   HGB 14.5 10/23/2013   HCT 44.1 10/23/2013   MCV 91.9  10/23/2013   PLT 170 02/10/2010   Lab Results  Component Value Date   NA 144 10/23/2013   K 4.8 10/23/2013   CL 103 10/23/2013   CO2 24 10/23/2013   Lab Results  Component Value Date   ALT 26 10/23/2013   AST 32 10/23/2013   ALKPHOS 115 10/23/2013   BILITOT 1.3 (H) 10/23/2013     RADIOGRAPHY: Mr Prostate W Wo Contrast  Result Date: 12/25/2018 CLINICAL DATA:  Prostate carcinoma surveillance. Elevated PSA 7.5. prostate cancer diagnosed 2009. Biopsy 01/22/2014 with Gleason 3+3=6 within LEFT and RIGHT gland. EXAM: MR PROSTATE WITHOUT AND WITH CONTRAST TECHNIQUE: Multiplanar multisequence MRI images were obtained of the pelvis centered about the prostate. Pre and post contrast images were obtained. CONTRAST:  6mL MULTIHANCE GADOBENATE DIMEGLUMINE 529 MG/ML IV SOLN Creatinine was obtained on site at [facility] at [address] Results: Creatinine 1.2 mg/dL. COMPARISON:  Prostate MRI 02/27/2018 FINDINGS: Prostate: Within the LEFT mid gland/LEFT lateral mid gland suspicious lesion again demonstrated which is low signal intensity on T2 weighted imaging (series 8). On T2 weighted imaging lesion measures 16 x 8 mm compared to 11 x 6 mm on prior remeasured. There is a focus of restricted diffusion within this lesion which measures smaller at 10 mm x 10 mm (image 11/11). Post-contrast enhancement lesion region is similar size to the T2 sequence measuring 21 by 10 mm (image 10/8). Lesion is confined within the prostate capsule. No additional foci of restricted diffusion in the peripheral zone. The transitional zone is mildly enlarged with capsulated nodules. Volume: 4.0 by 3.2 by 5.2 cm (volume = 35 cm^3) Transcapsular spread:  Absent Seminal vesicle involvement: Absent Neurovascular bundle involvement: Absent Pelvic adenopathy: Absent Bone metastasis: Absent Other findings: none IMPRESSION: 1. Suspicious lesion in the LEFT lateral mid gland increased in size from 02/27/2018 concerning for  high-grade carcinoma ( PI-RADS: 4). 3D post processing (Dynacad) performed 2. No transscapular spread.  Seminal vesicles are normal. 3. No lymphadenopathy. Electronically Signed   By: Suzy Bouchard M.D.   On: 12/25/2018 08:40      IMPRESSION/PLAN: 1. 73 y.o. gentleman with Stage T1c adenocarcinoma of the prostate  with Gleason Score of 3+3, and PSA of 7.51, pending repeat biopsy. We discussed the patient's workup and outlined the nature of prostate cancer in this setting. Based on the patient's last biopsy in 2015, his T stage, Gleason's score, and PSA put him into the low risk group. However, because this biopsy was done five years ago, and he has had recent significant change in PSA value, we discussed the rationale for repeating a prostate biopsy to help better inform our formal treatment recommendation as to avoid potentially under-treating a more aggressive prostate cancer or possible over-treating a stable, low risk prostate cancer. We discussed that appropriate treatment options would depend on the results of an updated biopsy to determine whether he is eligible for continued active surveillance, brachytherapy alone, 5.5-8 weeks of external beam radiation, or 5 weeks of external radiation followed by a brachytherapy boost. We discussed the available radiation techniques, and focused on the details and logistics and delivery. We discussed and outlined the risks, benefits, short and long-term effects associated with radiotherapy. We also discussed the potential role of ADT combined with radiation treatment in the management of more aggressive prostate cancers and outlined the potential risks and benefits associated with this treatment.  He was encouraged to ask questions that were answered to his stated satisfaction.  At the end of the conversation the patient is interested in moving forward with repeat TRUS and biopsy for appropriate disease staging. We will share our discussion with Dr. Lovena Neighbours so  that he can coordinate the prostate biopsy in the near future. We look forward to following his progress, and pending biopsy results, would be more than happy to continue to participate in his care if clinically indicated and desired.    This encounter was provided by telemedicine platform Webex.  The patient has given verbal consent for this type of encounter and has been advised to only accept a meeting of this type in a secure network environment. The time spent during this encounter was 60 minutes. The attendants for this meeting include Tyler Pita MD, Freeman Caldron PA-C, scribe Tyler Pita and patient, Fateh Kindle.  During the encounter, Tyler Pita MD, Ashlyn Bruning PA-C, and scribe Tyler Pita were located at [facility] Radiation Oncology Department.  Patient, Jeremy Caldwell was located at home.    Nicholos Johns, PA-C    Tyler Pita, MD  [facility] Direct Dial: [phone]   Fax: [phone] [email or web address]   Skype   [email or web address]  This document serves as a record of services personally performed by Tyler Pita, MD and Freeman Caldron, PA-C. It was created on their behalf by Rae Lips, a trained medical scribe. The creation of this record is based on the scribe's personal observations and the providers' statements to them. This document has been checked and approved by the attending providers.

## 2019-01-14 NOTE — Progress Notes (Signed)
See progress note under physician encounter. 

## 2019-01-27 ENCOUNTER — Encounter: Payer: Self-pay | Admitting: Medical Oncology

## 2019-01-28 NOTE — Progress Notes (Signed)
Spoke with Mr. Jeremy Caldwell to introduce myself as the prostate nurse navigator and discuss my role. I was unable to meet him 6/2 when he consulted with Dr. Tammi Klippel. He states the consult went extremely well, and he liked it better than having to come into the office. He informs me he was originally diagnosed in 2014 with prostate cancer. In 2015 had a repeat biopsy but has not had a recent biopsy. His PSA is rising but he refused to under go a repeat biopsy. After his consult with Dr.Manning he understands the importance of having another biopsy. He is scheduled for biopsy with Dr. Lovena Neighbours 02/28/19. We discussed how the results will determine the best treatment which could be continued active surveillance or moving forward with radiation. I gave him my office number and asked him to call me with questions or concerns and I will return his call. He voiced understanding.

## 2019-03-25 ENCOUNTER — Other Ambulatory Visit: Payer: Self-pay

## 2019-03-25 ENCOUNTER — Encounter: Payer: Self-pay | Admitting: Urology

## 2019-03-25 ENCOUNTER — Ambulatory Visit
Admission: RE | Admit: 2019-03-25 | Discharge: 2019-03-25 | Disposition: A | Discharge status: Home / Self Care | Payer: Medicare Other | Source: Ambulatory Visit | Attending: Urology | Admitting: Urology

## 2019-03-25 ENCOUNTER — Ambulatory Visit
Admission: RE | Admit: 2019-03-25 | Discharge: 2019-03-25 | Disposition: A | Discharge status: Home / Self Care | Payer: Medicare Other | Source: Ambulatory Visit | Attending: Radiation Oncology | Admitting: Radiation Oncology

## 2019-03-25 ENCOUNTER — Encounter: Payer: Self-pay | Admitting: Medical Oncology

## 2019-03-25 DIAGNOSIS — C61 Malignant neoplasm of prostate: Secondary | ICD-10-CM

## 2019-03-25 NOTE — Progress Notes (Signed)
Radiation Oncology         [phone] ________________________________  Outpatient Follow Up New - Conducted via WebEx due to current COVID-19 concerns for limiting patient exposure  Name: Jeremy Caldwell MRN: [id number]  Date: 03/25/2019  DOB: 1946/07/16  BO:FBPZ, Edwyna Shell, MD  Davis Gourd*   REFERRING PHYSICIAN: Davis Gourd*  DIAGNOSIS: 73 y.o. gentleman with Stage T1c adenocarcinoma of the prostate with Gleason score of 4+3, and PSA of 7.51    ICD-10-CM   1. Malignant neoplasm of prostate ([place])  C61     HISTORY OF PRESENT ILLNESS (01/14/2019): Jeremy Caldwell is a 73 y.o. male with a diagnosis of prostate cancer. He was initially diagnosed with T1c, Gleason 3+3 prostate cancer in 2/12 cores on 04/17/2013 prostate biopsy; PSA at time of diagnosis was 4.8. He elected to proceed with active surveillance and had a repeat transrectal ultrasound with 12 biopsies of the prostate on 01/22/2014.  The prostate volume measured 35 cc. Fortunately, this biopsy reaveled stable, Gleason 3+3 disease in 4 of the 12 core samples.  He continued in close follow up for AS and his PSA had remained stable in the 4-5 range since his last biopsy in 2015.   He established care with Dr. Lovena Neighbours (previously followed by Dr. Risa Grill) in June 2019. An MRI of the prostate in July 2019 identified two adjacent lesions spanning the left lateral base and left lateral mid gland (PI-RADS 4). He was encouraged to proceed with repeat surveillance biopsy at that time but elected instead to continue to monitor his PSA and postpone repeat biopsy.  A PSA in January 2020 was further elevated at 6.52 and increased further to 7.51 in April 2020. Repeat prostate MRI on 12/24/2018 showed a suspicious lesion in the left lateral mid gland, increased in size from 11 x 6 mm on July scan to now 16 x 8 mm (PI-RADS 4). The prostate volume measured 35 cc.  There was no evidence of extracapsular spread, seminal vesicle involvement or  lymphadenopathy in the abdomen or pelvis.  PSA History 11/2018: PSA 7.51 08/2018: PSA 6.52 01/2018: PSA 5.23 06/2017: PSA 5.29 12/2016: PSA 5.10 04/2015: PSA 5.19 09/2014: PSA 4.60 05/2014: PSA 4.12 01/2014: PSA 5.05 09/2013: PSA 4.18  INTERVAL HISTORY (03/25/2019): The patient underwent repeat TRUS biopsy on 02/28/2019 for appropriate disease staging. The prostate volume measured 42 cc.  Out of 12 core biopsies, 3 were positive.  The maximum Gleason score was 4+3, and this was seen in 4/5 samples from the targeted ROI lesion and one core in the left mid lateral. Additionally, Gleason 3+4 disease was seen in the right base lateral, and Gleason 3+3 was in the right mid lateral.  The patient reviewed the biopsy results with his urologist and he has kindly been referred back today for further discussion of potential radiation treatment options.  PREVIOUS RADIATION THERAPY: Patient reports being exposed to "nuclear stuff" while serving in [employer] and during employment as a Land but denies receiving any type of prior radiation treatment.   PAST MEDICAL HISTORY:  Past Medical History:  Diagnosis Date   Hypertension    Prostate cancer ([place])       PAST SURGICAL HISTORY: Past Surgical History:  Procedure Laterality Date   PROSTATE BIOPSY      FAMILY HISTORY:  Family History  Problem Relation Age of Onset   Cancer Mother        male ca   Cancer Father  either prostate or colon    SOCIAL HISTORY:  Social History   Socioeconomic History   Marital status: Married    Spouse name: Not on file   Number of children: 2   Years of education: Not on file   Highest education level: Not on file  Occupational History    Comment: retired Land  Social Needs   Financial resource strain: Not on file   Food insecurity    Worry: Not on file    Inability: Not on file   Transportation needs    Medical: Not on file    Non-medical: Not  on file  Tobacco Use   Smoking status: Former Smoker    Packs/day: 1.00    Years: 15.00    Pack years: 15.00    Types: Cigarettes    Quit date: 08/14/1976    Years since quitting: 42.6   Smokeless tobacco: Never Used  Substance and Sexual Activity   Alcohol use: Not Currently   Drug use: Never   Sexual activity: Not Currently  Lifestyle   Physical activity    Days per week: Not on file    Minutes per session: Not on file   Stress: Not on file  Relationships   Social connections    Talks on phone: Not on file    Gets together: Not on file    Attends religious service: Not on file    Active member of club or organization: Not on file    Attends meetings of clubs or organizations: Not on file    Relationship status: Not on file   Intimate partner violence    Fear of current or ex partner: Not on file    Emotionally abused: Not on file    Physically abused: Not on file    Forced sexual activity: Not on file  Other Topics Concern   Not on file  Social History Narrative   Married with two children (1 son, 1 daughter) and four grandchildren. Enjoys running 3-5 miles per day, lifting weights, and doing yoga.    ALLERGIES: Anesthesia s-i-60  MEDICATIONS:  Current Outpatient Medications  Medication Sig Dispense Refill   Multiple Vitamin (MULTIVITAMIN) tablet Take 1 tablet by mouth daily.     Multiple Vitamins-Minerals (OCUVITE PRESERVISION PO) Take by mouth.     NIFEdipine (PROCARDIA-XL/ADALAT CC) 30 MG 24 hr tablet TAKE 1 TABLET BY MOUTH DAILY 90 tablet 3   fluticasone (FLONASE) 50 MCG/ACT nasal spray Place 2 sprays into the nose daily. (Patient not taking: Reported on 03/25/2019) 16 g 6   No current facility-administered medications for this encounter.     REVIEW OF SYSTEMS:  On review of systems, the patient reports that he is doing well overall. He denies any chest pain, shortness of breath, cough, fevers, chills, night sweats, or unintended weight changes. He  denies any bowel disturbances, and denies abdominal pain, nausea or vomiting. He denies any new musculoskeletal or joint aches or pains. His IPSS was 1, indicating mild urinary symptoms with occasional urgency and frequency and nocturia x1. He denies dysuria, hematuria, leakage or incontinence. His SHIM was 3, indicating he does have severe erectile dysfunction. A complete review of systems is obtained and is otherwise negative.   PHYSICAL EXAM:  Wt Readings from Last 3 Encounters:  01/14/19 170 lb (77.1 kg)  03/25/13 169 lb 9.6 oz (76.9 kg)   Temp Readings from Last 3 Encounters:  03/25/13 97.1 F (36.2 C) (Oral)   BP Readings from Last  3 Encounters:  03/25/13 (!) 142/80   Pulse Readings from Last 3 Encounters:  03/25/13 (!) 47   In general this is a well appearing caucasian male in no acute distress. He is alert and oriented x4 and appropriate throughout the examination. Cardiopulmonary assessment is negative for acute distress and he exhibits normal effort. Remainder of exam not performed in light of virtual consultation platform.  KPS = 100  100 - Normal; no complaints; no evidence of disease. 90   - Able to carry on normal activity; minor signs or symptoms of disease. 80   - Normal activity with effort; some signs or symptoms of disease. 26   - Cares for self; unable to carry on normal activity or to do active work. 60   - Requires occasional assistance, but is able to care for most of his personal needs. 50   - Requires considerable assistance and frequent medical care. 73   - Disabled; requires special care and assistance. 35   - Severely disabled; hospital admission is indicated although death not imminent. 44   - Very sick; hospital admission necessary; active supportive treatment necessary. 10   - Moribund; fatal processes progressing rapidly. 0     - Dead  Karnofsky DA, Abelmann Navarro, Craver LS and Burchenal [facility] [phone]) The use of the nitrogen mustards in the palliative  treatment of carcinoma: with particular reference to bronchogenic carcinoma Cancer 1 634-56  LABORATORY DATA:  Lab Results  Component Value Date   WBC 6.5 10/23/2013   HGB 14.5 10/23/2013   HCT 44.1 10/23/2013   MCV 91.9 10/23/2013   PLT 170 02/10/2010   Lab Results  Component Value Date   NA 144 10/23/2013   K 4.8 10/23/2013   CL 103 10/23/2013   CO2 24 10/23/2013   Lab Results  Component Value Date   ALT 26 10/23/2013   AST 32 10/23/2013   ALKPHOS 115 10/23/2013   BILITOT 1.3 (H) 10/23/2013     RADIOGRAPHY: No results found.    IMPRESSION/PLAN: 1. 73 y.o. gentleman with Stage T1c adenocarcinoma of the prostate with Gleason Score of 4+3, and PSA of 7.51.  We discussed the patient's workup and outlined the nature of prostate cancer in this setting. The patient's T stage, Gleason's score, and PSA put him into the unfavorable intermediate risk group. Accordingly, he is eligible for a variety of potential treatment options including brachytherapy, 5.5 weeks of external radiation or 5 weeks of external radiation followed by a brachytherapy boost. We discussed the available radiation techniques, and focused on the details of logistics and delivery.  We discussed and outlined the risks, benefits, short and long-term effects associated with radiotherapy and compared and contrasted these with prostatectomy. We discussed the role of SpaceOAR in reducing the rectal toxicity associated with radiotherapy. We also detailed the role of ADT in the treatment of unfavorable intermediate risk prostate cancer and outlined the associated side effects that could be expected with this therapy. He was encouraged to ask questions that were answered to his stated satisfaction.  At the conclusion of our conversation the patient is interested in moving forward with 5.5 weeks of prostate IMRT without the use of ADT. He prefers to reserve ADT use if necessary and avoid the negative impact on his QOL at present  which we agree is reasonable.  He appears to have a good understanding of his disease and our recommendations for treatment of curative intent. We will share our discussion with Dr. Lovena Neighbours and move  forward with coordinating for fiducial marker and SpaceOAR gel placement prior to CT SIM in preparation to begin his radiation treatments in the near future. He is comfortable and in agreement with the stated plan.  This encounter was provided by telemedicine platform Webex.  The patient has given verbal consent for this type of encounter and has been advised to only accept a meeting of this type in a secure network environment. The time spent during this encounter was 30 minutes. The attendants for this meeting include Tyler Pita MD, Kendall Justo PA-C, and patient, Shamond Skelton.  During the encounter, Tyler Pita MD and Freeman Caldron PA-C were located at [facility] Radiation Oncology Department.  Patient, Jeremy Caldwell was located at home.    Nicholos Johns, PA-C    Tyler Pita, MD  [facility] Direct Dial: [phone]   Fax: [phone] [email or web address]   Skype   LinkedIn  This document serves as a record of services personally performed by Tyler Pita, MD and Freeman Caldron, PA-C. It was created on their behalf by Rae Lips, a trained medical scribe. The creation of this record is based on the scribe's personal observations and the providers' statements to them. This document has been checked and approved by the attending providers.

## 2019-04-03 ENCOUNTER — Telehealth: Payer: Self-pay | Admitting: *Deleted

## 2019-04-03 ENCOUNTER — Telehealth: Payer: Self-pay | Admitting: Radiation Oncology

## 2019-04-03 NOTE — Telephone Encounter (Signed)
Received voicemail message from patient concerned he hasn't heard back about scheduling fiducial marker and spaceoar placement. Patient consulted on 03/25/2019 during which time he expressed a desire to move forward with 5.5 weeks of radiation therapy without ADT. Per Ashlyn Burning, PA-C note patient will need fiducial markers and SpaceOar placed then SIM should be scheduled.

## 2019-04-03 NOTE — Telephone Encounter (Signed)
RETURNED PATIENT'S PHONE CALL, SPOKE WITH PATIENT. ?

## 2019-04-04 ENCOUNTER — Telehealth: Payer: Self-pay | Admitting: *Deleted

## 2019-04-04 NOTE — Telephone Encounter (Signed)
CALLED PATIENT TO INFORM OF FID. MARKERS AND SPACE OAR PLACEMENT ON 04-18-19 @ [facility] ON 04-22-19 - ARRIVAL TIME- 10:45 AM @ DR. MANNING'S OFFICE, SPOKE WITH PATIENT AND HE IS AWARE OF THESE APPTS.

## 2019-04-08 ENCOUNTER — Other Ambulatory Visit: Payer: Self-pay | Admitting: Urology

## 2019-04-08 DIAGNOSIS — C61 Malignant neoplasm of prostate: Secondary | ICD-10-CM

## 2019-04-09 ENCOUNTER — Telehealth: Payer: Self-pay | Admitting: *Deleted

## 2019-04-09 NOTE — Telephone Encounter (Signed)
CALLED PATIENT TO INFORM OF MRI FOR 04-22-19 - ARRIVAL TIME- 2:30 PM @ [facility], NO RESTRICTIONS TO TEST, LVM FOR A RETURN CALL

## 2019-04-09 NOTE — Telephone Encounter (Signed)
RETURNED PATIENT'S PHONE CALL, SPOKE WITH PATIENT. ?

## 2019-04-22 ENCOUNTER — Other Ambulatory Visit: Payer: Self-pay

## 2019-04-22 ENCOUNTER — Ambulatory Visit (HOSPITAL_COMMUNITY)
Admission: RE | Admit: 2019-04-22 | Discharge: 2019-04-22 | Disposition: A | Discharge status: Home / Self Care | Payer: Medicare Other | Source: Ambulatory Visit | Attending: Urology | Admitting: Urology

## 2019-04-22 ENCOUNTER — Encounter: Payer: Self-pay | Admitting: Medical Oncology

## 2019-04-22 ENCOUNTER — Ambulatory Visit
Admission: RE | Admit: 2019-04-22 | Discharge: 2019-04-22 | Disposition: A | Discharge status: Home / Self Care | Payer: Medicare Other | Source: Ambulatory Visit | Attending: Radiation Oncology | Admitting: Radiation Oncology

## 2019-04-22 DIAGNOSIS — Z510 Encounter for antineoplastic radiation therapy: Secondary | ICD-10-CM | POA: Diagnosis not present

## 2019-04-22 DIAGNOSIS — C61 Malignant neoplasm of prostate: Secondary | ICD-10-CM | POA: Diagnosis not present

## 2019-04-22 NOTE — Progress Notes (Signed)
  Radiation Oncology         [phone]) [phone] ________________________________  Name: Jeremy Caldwell MRN: [id number]  Date: 04/22/2019  DOB: 09/13/1945  SIMULATION AND TREATMENT PLANNING NOTE    ICD-10-CM   1. Malignant neoplasm of prostate ([place])  C61     DIAGNOSIS:  73 y.o. gentleman with Stage T1c adenocarcinoma of the prostate with Gleason score of 4+3, and PSA of 7.51  NARRATIVE:  The patient was brought to the [place].  Identity was confirmed.  All relevant records and images related to the planned course of therapy were reviewed.  The patient freely provided informed written consent to proceed with treatment after reviewing the details related to the planned course of therapy. The consent form was witnessed and verified by the simulation staff.  Then, the patient was set-up in a stable reproducible supine position for radiation therapy.  A vacuum lock pillow device was custom fabricated to position his legs in a reproducible immobilized position.  Then, I performed a urethrogram under sterile conditions to identify the prostatic apex.  CT images were obtained.  Surface markings were placed.  The CT images were loaded into the planning software.  Then the prostate target and avoidance structures including the rectum, bladder, bowel and hips were contoured.  Treatment planning then occurred.  The radiation prescription was entered and confirmed.  A total of one complex treatment devices was fabricated. I have requested : Intensity Modulated Radiotherapy (IMRT) is medically necessary for this case for the following reason:  Rectal sparing.Marland Kitchen  PLAN:  The patient will receive 70 Gy in 28 fractions.  ________________________________  Sheral Apley Tammi Klippel, M.D.

## 2019-04-24 ENCOUNTER — Encounter: Payer: Self-pay | Admitting: *Deleted

## 2019-04-25 DIAGNOSIS — C61 Malignant neoplasm of prostate: Secondary | ICD-10-CM | POA: Diagnosis not present

## 2019-05-01 ENCOUNTER — Other Ambulatory Visit: Payer: Self-pay

## 2019-05-01 ENCOUNTER — Ambulatory Visit
Admission: RE | Admit: 2019-05-01 | Discharge: 2019-05-01 | Disposition: A | Discharge status: Home / Self Care | Payer: Medicare Other | Source: Ambulatory Visit | Attending: Radiation Oncology | Admitting: Radiation Oncology

## 2019-05-01 DIAGNOSIS — C61 Malignant neoplasm of prostate: Secondary | ICD-10-CM | POA: Diagnosis not present

## 2019-05-02 ENCOUNTER — Ambulatory Visit
Admission: RE | Admit: 2019-05-02 | Discharge: 2019-05-02 | Disposition: A | Discharge status: Home / Self Care | Payer: Medicare Other | Source: Ambulatory Visit | Attending: Radiation Oncology | Admitting: Radiation Oncology

## 2019-05-02 ENCOUNTER — Other Ambulatory Visit: Payer: Self-pay

## 2019-05-02 DIAGNOSIS — C61 Malignant neoplasm of prostate: Secondary | ICD-10-CM | POA: Diagnosis not present

## 2019-05-05 ENCOUNTER — Other Ambulatory Visit: Payer: Self-pay

## 2019-05-05 ENCOUNTER — Ambulatory Visit
Admission: RE | Admit: 2019-05-05 | Discharge: 2019-05-05 | Disposition: A | Discharge status: Home / Self Care | Payer: Medicare Other | Source: Ambulatory Visit | Attending: Radiation Oncology | Admitting: Radiation Oncology

## 2019-05-05 DIAGNOSIS — C61 Malignant neoplasm of prostate: Secondary | ICD-10-CM | POA: Diagnosis not present

## 2019-05-06 ENCOUNTER — Ambulatory Visit
Admission: RE | Admit: 2019-05-06 | Discharge: 2019-05-06 | Disposition: A | Discharge status: Home / Self Care | Payer: Medicare Other | Source: Ambulatory Visit | Attending: Radiation Oncology | Admitting: Radiation Oncology

## 2019-05-06 DIAGNOSIS — C61 Malignant neoplasm of prostate: Secondary | ICD-10-CM | POA: Diagnosis not present

## 2019-05-07 ENCOUNTER — Other Ambulatory Visit: Payer: Self-pay

## 2019-05-07 ENCOUNTER — Ambulatory Visit
Admission: RE | Admit: 2019-05-07 | Discharge: 2019-05-07 | Disposition: A | Discharge status: Home / Self Care | Payer: Medicare Other | Source: Ambulatory Visit | Attending: Radiation Oncology | Admitting: Radiation Oncology

## 2019-05-07 DIAGNOSIS — C61 Malignant neoplasm of prostate: Secondary | ICD-10-CM | POA: Diagnosis not present

## 2019-05-08 ENCOUNTER — Ambulatory Visit
Admission: RE | Admit: 2019-05-08 | Discharge: 2019-05-08 | Disposition: A | Discharge status: Home / Self Care | Payer: Medicare Other | Source: Ambulatory Visit | Attending: Radiation Oncology | Admitting: Radiation Oncology

## 2019-05-08 ENCOUNTER — Other Ambulatory Visit: Payer: Self-pay

## 2019-05-08 DIAGNOSIS — C61 Malignant neoplasm of prostate: Secondary | ICD-10-CM | POA: Diagnosis not present

## 2019-05-09 ENCOUNTER — Other Ambulatory Visit: Payer: Self-pay | Admitting: Radiation Oncology

## 2019-05-09 ENCOUNTER — Ambulatory Visit
Admission: RE | Admit: 2019-05-09 | Discharge: 2019-05-09 | Disposition: A | Discharge status: Home / Self Care | Payer: Medicare Other | Source: Ambulatory Visit | Attending: Radiation Oncology | Admitting: Radiation Oncology

## 2019-05-09 ENCOUNTER — Other Ambulatory Visit: Payer: Self-pay

## 2019-05-09 DIAGNOSIS — C61 Malignant neoplasm of prostate: Secondary | ICD-10-CM | POA: Diagnosis not present

## 2019-05-09 MED ORDER — TAMSULOSIN HCL 0.4 MG PO CAPS
0.4000 mg | ORAL_CAPSULE | Freq: Every day | ORAL | 5 refills | Status: AC
Start: 2019-05-09 — End: ?

## 2019-05-12 ENCOUNTER — Ambulatory Visit
Admission: RE | Admit: 2019-05-12 | Discharge: 2019-05-12 | Disposition: A | Discharge status: Home / Self Care | Payer: Medicare Other | Source: Ambulatory Visit | Attending: Radiation Oncology | Admitting: Radiation Oncology

## 2019-05-12 ENCOUNTER — Other Ambulatory Visit: Payer: Self-pay

## 2019-05-12 ENCOUNTER — Encounter: Payer: Self-pay | Admitting: Medical Oncology

## 2019-05-12 DIAGNOSIS — C61 Malignant neoplasm of prostate: Secondary | ICD-10-CM | POA: Diagnosis not present

## 2019-05-13 ENCOUNTER — Other Ambulatory Visit: Payer: Self-pay

## 2019-05-13 ENCOUNTER — Ambulatory Visit
Admission: RE | Admit: 2019-05-13 | Discharge: 2019-05-13 | Disposition: A | Discharge status: Home / Self Care | Payer: Medicare Other | Source: Ambulatory Visit | Attending: Radiation Oncology | Admitting: Radiation Oncology

## 2019-05-13 DIAGNOSIS — C61 Malignant neoplasm of prostate: Secondary | ICD-10-CM | POA: Diagnosis not present

## 2019-05-14 ENCOUNTER — Ambulatory Visit
Admission: RE | Admit: 2019-05-14 | Discharge: 2019-05-14 | Disposition: A | Discharge status: Home / Self Care | Payer: Medicare Other | Source: Ambulatory Visit | Attending: Radiation Oncology | Admitting: Radiation Oncology

## 2019-05-14 ENCOUNTER — Other Ambulatory Visit: Payer: Self-pay

## 2019-05-14 DIAGNOSIS — C61 Malignant neoplasm of prostate: Secondary | ICD-10-CM | POA: Diagnosis not present

## 2019-05-15 ENCOUNTER — Ambulatory Visit
Admission: RE | Admit: 2019-05-15 | Discharge: 2019-05-15 | Disposition: A | Discharge status: Home / Self Care | Payer: Medicare Other | Source: Ambulatory Visit | Attending: Radiation Oncology | Admitting: Radiation Oncology

## 2019-05-15 ENCOUNTER — Other Ambulatory Visit: Payer: Self-pay

## 2019-05-15 DIAGNOSIS — C61 Malignant neoplasm of prostate: Secondary | ICD-10-CM | POA: Diagnosis present

## 2019-05-15 DIAGNOSIS — Z510 Encounter for antineoplastic radiation therapy: Secondary | ICD-10-CM | POA: Insufficient documentation

## 2019-05-16 ENCOUNTER — Ambulatory Visit
Admission: RE | Admit: 2019-05-16 | Discharge: 2019-05-16 | Disposition: A | Discharge status: Home / Self Care | Payer: Medicare Other | Source: Ambulatory Visit | Attending: Radiation Oncology | Admitting: Radiation Oncology

## 2019-05-16 DIAGNOSIS — C61 Malignant neoplasm of prostate: Secondary | ICD-10-CM | POA: Diagnosis not present

## 2019-05-19 ENCOUNTER — Ambulatory Visit
Admission: RE | Admit: 2019-05-19 | Discharge: 2019-05-19 | Disposition: A | Discharge status: Home / Self Care | Payer: Medicare Other | Source: Ambulatory Visit | Attending: Radiation Oncology | Admitting: Radiation Oncology

## 2019-05-19 ENCOUNTER — Other Ambulatory Visit: Payer: Self-pay

## 2019-05-19 DIAGNOSIS — C61 Malignant neoplasm of prostate: Secondary | ICD-10-CM | POA: Diagnosis not present

## 2019-05-20 ENCOUNTER — Other Ambulatory Visit: Payer: Self-pay

## 2019-05-20 ENCOUNTER — Ambulatory Visit
Admission: RE | Admit: 2019-05-20 | Discharge: 2019-05-20 | Disposition: A | Discharge status: Home / Self Care | Payer: Medicare Other | Source: Ambulatory Visit | Attending: Radiation Oncology | Admitting: Radiation Oncology

## 2019-05-20 DIAGNOSIS — C61 Malignant neoplasm of prostate: Secondary | ICD-10-CM | POA: Diagnosis not present

## 2019-05-21 ENCOUNTER — Ambulatory Visit
Admission: RE | Admit: 2019-05-21 | Discharge: 2019-05-21 | Disposition: A | Discharge status: Home / Self Care | Payer: Medicare Other | Source: Ambulatory Visit | Attending: Radiation Oncology | Admitting: Radiation Oncology

## 2019-05-21 DIAGNOSIS — C61 Malignant neoplasm of prostate: Secondary | ICD-10-CM | POA: Diagnosis not present

## 2019-05-22 ENCOUNTER — Ambulatory Visit
Admission: RE | Admit: 2019-05-22 | Discharge: 2019-05-22 | Disposition: A | Discharge status: Home / Self Care | Payer: Medicare Other | Source: Ambulatory Visit | Attending: Radiation Oncology | Admitting: Radiation Oncology

## 2019-05-22 ENCOUNTER — Other Ambulatory Visit: Payer: Self-pay

## 2019-05-22 DIAGNOSIS — C61 Malignant neoplasm of prostate: Secondary | ICD-10-CM | POA: Diagnosis not present

## 2019-05-23 ENCOUNTER — Ambulatory Visit
Admission: RE | Admit: 2019-05-23 | Discharge: 2019-05-23 | Disposition: A | Discharge status: Home / Self Care | Payer: Medicare Other | Source: Ambulatory Visit | Attending: Radiation Oncology | Admitting: Radiation Oncology

## 2019-05-23 DIAGNOSIS — C61 Malignant neoplasm of prostate: Secondary | ICD-10-CM | POA: Diagnosis not present

## 2019-05-26 ENCOUNTER — Ambulatory Visit
Admission: RE | Admit: 2019-05-26 | Discharge: 2019-05-26 | Disposition: A | Discharge status: Home / Self Care | Payer: Medicare Other | Source: Ambulatory Visit | Attending: Radiation Oncology | Admitting: Radiation Oncology

## 2019-05-26 DIAGNOSIS — C61 Malignant neoplasm of prostate: Secondary | ICD-10-CM | POA: Diagnosis not present

## 2019-05-27 ENCOUNTER — Ambulatory Visit
Admission: RE | Admit: 2019-05-27 | Discharge: 2019-05-27 | Disposition: A | Discharge status: Home / Self Care | Payer: Medicare Other | Source: Ambulatory Visit | Attending: Radiation Oncology | Admitting: Radiation Oncology

## 2019-05-27 DIAGNOSIS — C61 Malignant neoplasm of prostate: Secondary | ICD-10-CM | POA: Diagnosis not present

## 2019-05-28 ENCOUNTER — Ambulatory Visit
Admission: RE | Admit: 2019-05-28 | Discharge: 2019-05-28 | Disposition: A | Discharge status: Home / Self Care | Payer: Medicare Other | Source: Ambulatory Visit | Attending: Radiation Oncology | Admitting: Radiation Oncology

## 2019-05-28 DIAGNOSIS — C61 Malignant neoplasm of prostate: Secondary | ICD-10-CM | POA: Diagnosis not present

## 2019-05-29 ENCOUNTER — Other Ambulatory Visit: Payer: Self-pay

## 2019-05-29 ENCOUNTER — Ambulatory Visit
Admission: RE | Admit: 2019-05-29 | Discharge: 2019-05-29 | Disposition: A | Discharge status: Home / Self Care | Payer: Medicare Other | Source: Ambulatory Visit | Attending: Radiation Oncology | Admitting: Radiation Oncology

## 2019-05-29 DIAGNOSIS — C61 Malignant neoplasm of prostate: Secondary | ICD-10-CM | POA: Diagnosis not present

## 2019-05-30 ENCOUNTER — Other Ambulatory Visit: Payer: Self-pay

## 2019-05-30 ENCOUNTER — Ambulatory Visit
Admission: RE | Admit: 2019-05-30 | Discharge: 2019-05-30 | Disposition: A | Discharge status: Home / Self Care | Payer: Medicare Other | Source: Ambulatory Visit | Attending: Radiation Oncology | Admitting: Radiation Oncology

## 2019-05-30 DIAGNOSIS — C61 Malignant neoplasm of prostate: Secondary | ICD-10-CM | POA: Diagnosis not present

## 2019-06-02 ENCOUNTER — Other Ambulatory Visit: Payer: Self-pay

## 2019-06-02 ENCOUNTER — Ambulatory Visit
Admission: RE | Admit: 2019-06-02 | Discharge: 2019-06-02 | Disposition: A | Discharge status: Home / Self Care | Payer: Medicare Other | Source: Ambulatory Visit | Attending: Radiation Oncology | Admitting: Radiation Oncology

## 2019-06-02 DIAGNOSIS — C61 Malignant neoplasm of prostate: Secondary | ICD-10-CM | POA: Diagnosis not present

## 2019-06-03 ENCOUNTER — Other Ambulatory Visit: Payer: Self-pay

## 2019-06-03 ENCOUNTER — Ambulatory Visit
Admission: RE | Admit: 2019-06-03 | Discharge: 2019-06-03 | Disposition: A | Discharge status: Home / Self Care | Payer: Medicare Other | Source: Ambulatory Visit | Attending: Radiation Oncology | Admitting: Radiation Oncology

## 2019-06-03 DIAGNOSIS — C61 Malignant neoplasm of prostate: Secondary | ICD-10-CM | POA: Diagnosis not present

## 2019-06-04 ENCOUNTER — Ambulatory Visit
Admission: RE | Admit: 2019-06-04 | Discharge: 2019-06-04 | Disposition: A | Discharge status: Home / Self Care | Payer: Medicare Other | Source: Ambulatory Visit | Attending: Radiation Oncology | Admitting: Radiation Oncology

## 2019-06-04 ENCOUNTER — Other Ambulatory Visit: Payer: Self-pay

## 2019-06-04 DIAGNOSIS — C61 Malignant neoplasm of prostate: Secondary | ICD-10-CM | POA: Diagnosis not present

## 2019-06-05 ENCOUNTER — Other Ambulatory Visit: Payer: Self-pay

## 2019-06-05 ENCOUNTER — Ambulatory Visit
Admission: RE | Admit: 2019-06-05 | Discharge: 2019-06-05 | Disposition: A | Discharge status: Home / Self Care | Payer: Medicare Other | Source: Ambulatory Visit | Attending: Radiation Oncology | Admitting: Radiation Oncology

## 2019-06-05 DIAGNOSIS — C61 Malignant neoplasm of prostate: Secondary | ICD-10-CM | POA: Diagnosis not present

## 2019-06-06 ENCOUNTER — Ambulatory Visit
Admission: RE | Admit: 2019-06-06 | Discharge: 2019-06-06 | Disposition: A | Discharge status: Home / Self Care | Payer: Medicare Other | Source: Ambulatory Visit | Attending: Radiation Oncology | Admitting: Radiation Oncology

## 2019-06-06 ENCOUNTER — Other Ambulatory Visit: Payer: Self-pay

## 2019-06-06 DIAGNOSIS — C61 Malignant neoplasm of prostate: Secondary | ICD-10-CM | POA: Diagnosis not present

## 2019-06-09 ENCOUNTER — Ambulatory Visit
Admission: RE | Admit: 2019-06-09 | Discharge: 2019-06-09 | Disposition: A | Discharge status: Home / Self Care | Payer: Medicare Other | Source: Ambulatory Visit | Attending: Radiation Oncology | Admitting: Radiation Oncology

## 2019-06-09 ENCOUNTER — Other Ambulatory Visit: Payer: Self-pay

## 2019-06-09 ENCOUNTER — Encounter: Payer: Self-pay | Admitting: Radiation Oncology

## 2019-06-09 DIAGNOSIS — C61 Malignant neoplasm of prostate: Secondary | ICD-10-CM | POA: Diagnosis not present

## 2019-07-23 ENCOUNTER — Other Ambulatory Visit: Payer: Self-pay

## 2019-07-23 ENCOUNTER — Encounter: Payer: Self-pay | Admitting: Urology

## 2019-07-23 ENCOUNTER — Ambulatory Visit
Admission: RE | Admit: 2019-07-23 | Discharge: 2019-07-23 | Disposition: A | Discharge status: Home / Self Care | Payer: Medicare Other | Source: Ambulatory Visit | Attending: Urology | Admitting: Urology

## 2019-07-23 DIAGNOSIS — C61 Malignant neoplasm of prostate: Secondary | ICD-10-CM

## 2019-07-23 NOTE — Progress Notes (Signed)
Radiation Oncology         [phone] ________________________________  Name: Jeremy Caldwell MRN: [id number]  Date: 07/23/2019  DOB: [birth year]  Post Treatment Note  CC: Vernie Shanks, MD  Vernie Shanks, MD  Diagnosis:   73 y.o. gentleman with Stage T1c adenocarcinoma of the prostate with Gleason score of 4+3, and PSA of 7.51.  Interval Since Last Radiation:  6 weeks  05/01/19 - 06/09/19: The prostate was treated to 70 Gy in 28 fractions of 1.8 Gy each.   Narrative:  I spoke with the patient to conduct his routine scheduled 1 month follow up visit via telephone to spare the patient unnecessary potential exposure in the healthcare setting during the current COVID-19 pandemic.  The patient was notified in advance and gave permission to proceed with this visit format. He tolerated radiation treatments relatively well with only minor urinary irritation and modest fatigue.                             On review of systems, the patient states that he is doing very well overall.  He has noticed a gradual improvement in his LUTS with a current IPSS of 4.  He feels that he is pretty close to getting back to his baseline at this point. Pretreatment IPSS was 1.  He specifically denies dysuria, gross hematuria, straining to void, incomplete bladder emptying or incontinence.  He reports a healthy appetite and is maintaining his weight.  His energy level is gradually returning as well and he has resumed a normal exercise routine, often running 6 miles per day.  He denies abdominal pain, nausea, vomiting, diarrhea or constipation.  Overall, he is quite pleased with his progress to date.  ALLERGIES:  is allergic to propofol.  Meds: Current Outpatient Medications  Medication Sig Dispense Refill  . Multiple Vitamins-Minerals (OCUVITE PRESERVISION PO) Take by mouth.    Marland Kitchen NIFEdipine (PROCARDIA-XL/ADALAT CC) 30 MG 24 hr tablet TAKE 1 TABLET BY MOUTH DAILY 90 tablet 3  . tamsulosin (FLOMAX) 0.4 MG CAPS  capsule Take 1 capsule (0.4 mg total) by mouth daily after supper. 30 capsule 5  . fluticasone (FLONASE) 50 MCG/ACT nasal spray Place 2 sprays into the nose daily. (Patient not taking: Reported on 03/25/2019) 16 g 6  . Multiple Vitamin (MULTIVITAMIN) tablet Take 1 tablet by mouth daily.     No current facility-administered medications for this encounter.     Physical Findings:  vitals were not taken for this visit.   / Unable to assess due to telephone follow up visit format.  Lab Findings: Lab Results  Component Value Date   WBC 6.5 10/23/2013   HGB 14.5 10/23/2013   HCT 44.1 10/23/2013   MCV 91.9 10/23/2013   PLT 170 02/10/2010     Radiographic Findings: No results found.  Impression/Plan: 1. 73 y.o. gentleman with Stage T1c adenocarcinoma of the prostate with Gleason score of 4+3, and PSA of 7.51.  He will continue to follow up with urology for ongoing PSA determinations.  He does not currently have a follow-up appointment scheduled with Dr. Lovena Neighbours but anticipates a visit sometime after the new year for a repeat PSA. He understands what to expect with regards to PSA monitoring going forward. I will look forward to following his response to treatment via correspondence with urology, and would be happy to continue to participate in his care if clinically indicated. I talked to the patient about  what to expect in the future, including his risk for erectile dysfunction and rectal bleeding. I encouraged him to call or return to the office if he has any questions regarding his previous radiation or possible radiation side effects. He was comfortable with this plan and will follow up as needed.    Nicholos Johns, PA-C

## 2019-07-24 ENCOUNTER — Other Ambulatory Visit: Payer: Self-pay

## 2019-07-24 DIAGNOSIS — Z20822 Contact with and (suspected) exposure to covid-19: Secondary | ICD-10-CM

## 2019-07-25 LAB — NOVEL CORONAVIRUS, NAA: SARS-CoV-2, NAA: NOT DETECTED

## 2019-08-03 NOTE — Progress Notes (Signed)
  Radiation Oncology         [phone]) [phone] ________________________________  Name: Jeremy Caldwell MRN: [id number]  Date: 06/09/2019  DOB: 06-Mar-1946  End of Treatment Note  Diagnosis:   73 y.o. gentleman with Stage T1c adenocarcinoma of the prostate with Gleason score of 4+3, and PSA of 7.51     Indication for treatment:  Curative, Definitive Radiotherapy       Radiation treatment dates:   05/01/19-06/09/19  Site/dose:   The prostate was treated to 70 Gy in 28 fractions of 2.5 Gy  Beams/energy:   The patient was treated with IMRT using volumetric arc therapy delivering 6 MV X-rays to clockwise and counterclockwise circumferential arcs with a 90 degree collimator offset to avoid dose scalloping.  Image guidance was performed with daily cone beam CT prior to each fraction to align to gold markers in the prostate and assure proper bladder and rectal fill volumes.  Immobilization was achieved with BodyFix custom mold.  Narrative: The patient tolerated radiation treatment relatively well.   The patient experienced some minor urinary irritation and modest fatigue.    Plan: The patient has completed radiation treatment. He will return to radiation oncology clinic for routine followup in one month. I advised him to call or return sooner if he has any questions or concerns related to his recovery or treatment. ________________________________  Sheral Apley. Tammi Klippel, M.D.

## 2019-08-22 ENCOUNTER — Encounter: Payer: Self-pay | Admitting: *Deleted

## 2019-08-28 ENCOUNTER — Ambulatory Visit: Payer: Medicare Other | Attending: Internal Medicine

## 2019-08-28 DIAGNOSIS — Z23 Encounter for immunization: Secondary | ICD-10-CM | POA: Insufficient documentation

## 2019-08-28 NOTE — Progress Notes (Signed)
   Covid-19 Vaccination Clinic  Name:  Jeremy Caldwell    MRN: [id number] DOB: [birth year]  08/28/2019  Jeremy Caldwell was observed post Covid-19 immunization for 15 minutes without incidence. He was provided with Vaccine Information Sheet and instruction to access the V-Safe system.   Jeremy Caldwell was instructed to call 911 with any severe reactions post vaccine: Marland Kitchen Difficulty breathing  . Swelling of your face and throat  . A fast heartbeat  . A bad rash all over your body  . Dizziness and weakness    Immunizations Administered    Name Date Dose VIS Date Route   Pfizer COVID-19 Vaccine 08/28/2019  8:44 AM 0.3 mL 07/25/2019 Intramuscular   Manufacturer: [place]   Lot: [id number]   [place]: [id number]

## 2019-09-17 ENCOUNTER — Ambulatory Visit: Payer: Medicare PPO | Attending: Internal Medicine

## 2019-09-17 DIAGNOSIS — Z23 Encounter for immunization: Secondary | ICD-10-CM

## 2019-09-17 NOTE — Progress Notes (Signed)
   Covid-19 Vaccination Clinic  Name:  Jeremy Caldwell    MRN: [id number] DOB: 06-02-1946  09/17/2019  Mr. Sistare was observed post Covid-19 immunization for 15 minutes without incidence. He was provided with Vaccine Information Sheet and instruction to access the V-Safe system.   Mr. Rowlette was instructed to call 911 with any severe reactions post vaccine: Marland Kitchen Difficulty breathing  . Swelling of your face and throat  . A fast heartbeat  . A bad rash all over your body  . Dizziness and weakness    Immunizations Administered    Name Date Dose VIS Date Route   Pfizer COVID-19 Vaccine 09/17/2019  9:03 AM 0.3 mL 07/25/2019 Intramuscular   Manufacturer: [place]   Lot: [id number]   [place]: [id number]

## 2019-10-28 ENCOUNTER — Encounter: Payer: Self-pay | Admitting: Sports Medicine

## 2020-02-15 IMAGING — MR MR PROSTATE WO/W CM
56 series · 56 of 56 positions shown · IV contrast (Multihance 15ml)
Comparison: Prostate MRI 02/27/2018

CLINICAL DATA: Prostate carcinoma surveillance. Elevated PSA 7.5.
prostate cancer diagnosed 6773. Biopsy 01/22/2014 with Gleason 3+3=6
within LEFT and RIGHT gland.

EXAM:
MR PROSTATE WITHOUT AND WITH CONTRAST
TECHNIQUE: Multiplanar multisequence MRI images were obtained of the pelvis
centered about the prostate. Pre and post contrast images were
obtained.
CONTRAST:  15mL MULTIHANCE GADOBENATE DIMEGLUMINE 529 MG/ML IV SOLN
Creatinine was obtained on site at [HOSPITAL] at [HOSPITAL].
Results: Creatinine 1.2 mg/dL.

[Series 2: new loc · axial · 6.0mm · 0.88mm/px · 1 of 33 slices shown]
[im 1/33]
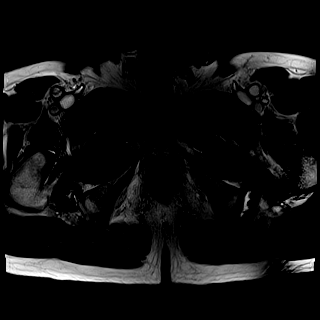

[Series 3: T1 · axial · 8.0mm · 1.06mm/px · 1 of 28 slices shown (1 of 2)]
[im 1/28]
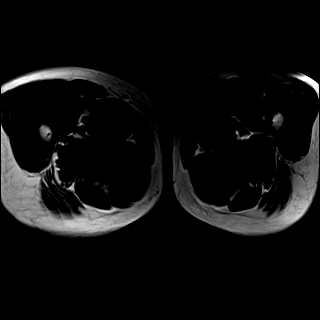

[Series 4: bSSFP fat-sat · axial · 8.0mm · 0.74mm/px · 1 of 28 slices shown]
[im 1/28]
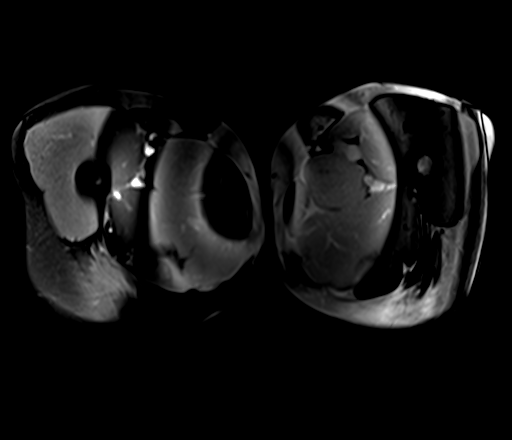

[Series 5: T2 · sagittal · 3.5mm · 0.56mm/px · 1 of 39 slices shown (1 of 4)]
[im 1/39]
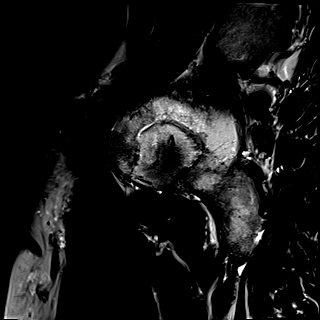

[Series 6: T2 · coronal · 3.5mm · 0.56mm/px · 1 of 23 slices shown (2 of 4)]
[im 1/23]
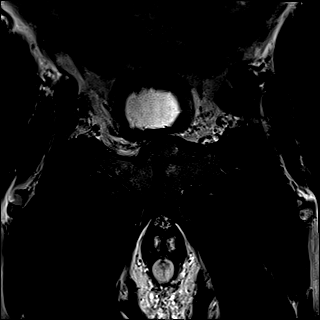

[Series 7: T1 · axial · 3.0mm · 0.31mm/px · 1 of 24 slices shown (2 of 2)]
[im 1/24]
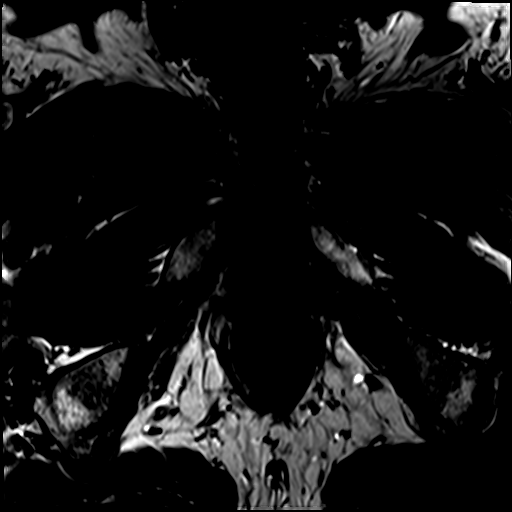

[Series 8: T2 · axial · 3.5mm · 0.56mm/px · 1 of 23 slices shown (3 of 4)]
[im 1/23]
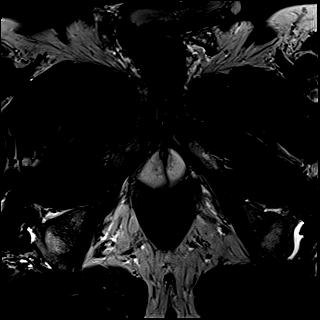

[Series 9: T2 · axial · 1.0mm · 1.04mm/px · 1 of 80 slices shown (4 of 4)]
[im 1/80]
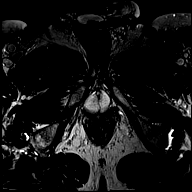

[Series 10: DWI · axial · 3.5mm · 1.56mm/px · 1 of 60 slices shown (1 of 2)]
[im 1/60]
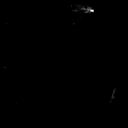

[Series 11: DWI · axial · 3.5mm · 1.56mm/px · 1 of 20 slices shown (2 of 2)]
[im 1/20]
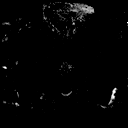

[Series 12: pre t1_twist_tra_dyn_ttc=5.3s · axial · non-contrast · 3.5mm · 0.83mm/px · 1 of 20 slices shown]
[im 1/20]
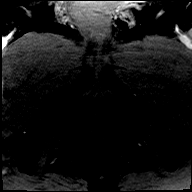

[Series 13: post t1_twist_tra_dyn-copy center · axial · 3.5mm · 0.83mm/px · 1 of 20 slices shown (1 of 23)]
[im 1/20]
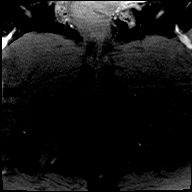

[Series 14: post t1_twist_tra_dyn-copy center · axial · 3.5mm · 0.83mm/px · 1 of 20 slices shown (2 of 23)]
[im 1/20]
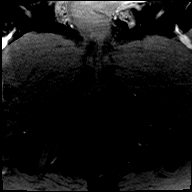

[Series 15: post t1_twist_tra_dyn-copy cent_sub_ttc=(id) · axial · 3.5mm · 0.83mm/px · 1 of 20 slices shown (1 of 22)]
[im 1/20]
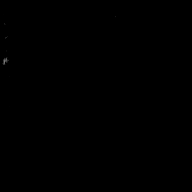

[Series 16: post t1_twist_tra_dyn-copy center · axial · 3.5mm · 0.83mm/px · 1 of 20 slices shown (3 of 23)]
[im 1/20]
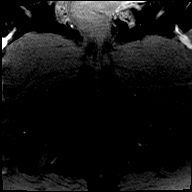

[Series 17: post t1_twist_tra_dyn-copy cent_sub_ttc=(id) · axial · 3.5mm · 0.83mm/px · 1 of 20 slices shown (2 of 22)]
[im 1/20]
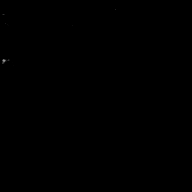

[Series 18: post t1_twist_tra_dyn-copy center · axial · 3.5mm · 0.83mm/px · 1 of 20 slices shown (4 of 23)]
[im 1/20]
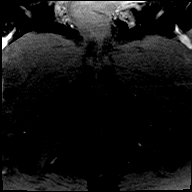

[Series 19: post t1_twist_tra_dyn-copy cent_sub_ttc=(id) · axial · 3.5mm · 0.83mm/px · 1 of 20 slices shown (3 of 22)]
[im 1/20]
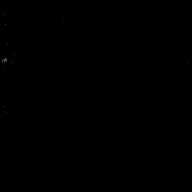

[Series 20: post t1_twist_tra_dyn-copy center · axial · 3.5mm · 0.83mm/px · 1 of 20 slices shown (5 of 23)]
[im 1/20]
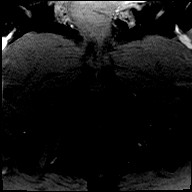

[Series 21: post t1_twist_tra_dyn-copy cent_sub_ttc=(id) · axial · 3.5mm · 0.83mm/px · 1 of 20 slices shown (4 of 22)]
[im 1/20]
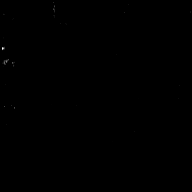

[Series 22: post t1_twist_tra_dyn-copy center · axial · 3.5mm · 0.83mm/px · 1 of 20 slices shown (6 of 23)]
[im 1/20]
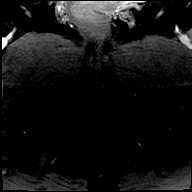

[Series 23: post t1_twist_tra_dyn-copy cent_sub_ttc=(id) · axial · 3.5mm · 0.83mm/px · 1 of 20 slices shown (5 of 22)]
[im 1/20]
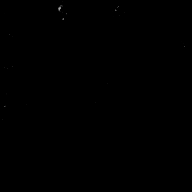

[Series 24: post t1_twist_tra_dyn-copy center · axial · 3.5mm · 0.83mm/px · 1 of 20 slices shown (7 of 23)]
[im 1/20]
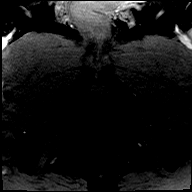

[Series 25: post t1_twist_tra_dyn-copy cent_sub_ttc=(id) · axial · 3.5mm · 0.83mm/px · 1 of 20 slices shown (6 of 22)]
[im 1/20]
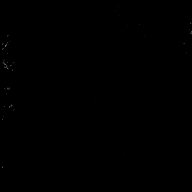

[Series 26: post t1_twist_tra_dyn-copy center · axial · 3.5mm · 0.83mm/px · 1 of 20 slices shown (8 of 23)]
[im 1/20]
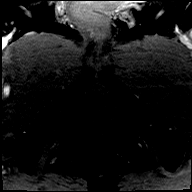

[Series 27: post t1_twist_tra_dyn-copy cent_sub_ttc=(id) · axial · 3.5mm · 0.83mm/px · 1 of 20 slices shown (7 of 22)]
[im 1/20]
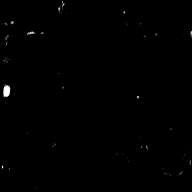

[Series 28: post t1_twist_tra_dyn-copy center · axial · 3.5mm · 0.83mm/px · 1 of 20 slices shown (9 of 23)]
[im 1/20]
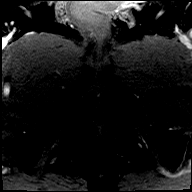

[Series 29: post t1_twist_tra_dyn-copy cent_sub_ttc=(id) · axial · 3.5mm · 0.83mm/px · 1 of 20 slices shown (8 of 22)]
[im 1/20]
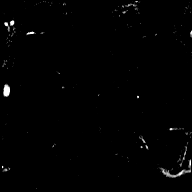

[Series 30: post t1_twist_tra_dyn-copy center · axial · 3.5mm · 0.83mm/px · 1 of 20 slices shown (10 of 23)]
[im 1/20]
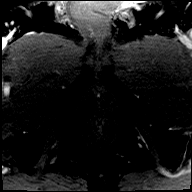

[Series 31: post t1_twist_tra_dyn-copy cent_sub_ttc=(id) · axial · 3.5mm · 0.83mm/px · 1 of 20 slices shown (9 of 22)]
[im 1/20]
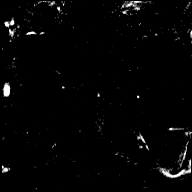

[Series 32: post t1_twist_tra_dyn-copy center · axial · 3.5mm · 0.83mm/px · 1 of 20 slices shown (11 of 23)]
[im 1/20]
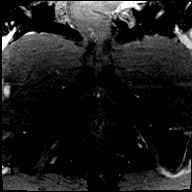

[Series 33: post t1_twist_tra_dyn-copy cent_sub_ttc=(id) · axial · 3.5mm · 0.83mm/px · 1 of 20 slices shown (10 of 22)]
[im 1/20]
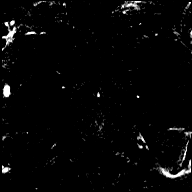

[Series 34: post t1_twist_tra_dyn-copy center · axial · 3.5mm · 0.83mm/px · 1 of 20 slices shown (12 of 23)]
[im 1/20]
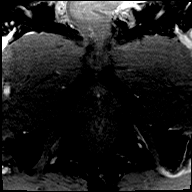

[Series 35: post t1_twist_tra_dyn-copy cent_sub_ttc=(id) · axial · 3.5mm · 0.83mm/px · 1 of 20 slices shown (11 of 22)]
[im 1/20]
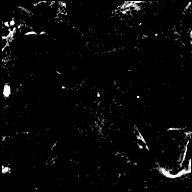

[Series 36: post t1_twist_tra_dyn-copy center · axial · 3.5mm · 0.83mm/px · 1 of 20 slices shown (13 of 23)]
[im 1/20]
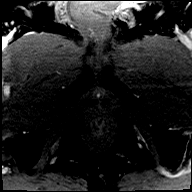

[Series 37: post t1_twist_tra_dyn-copy cent_sub_ttc=(id) · axial · 3.5mm · 0.83mm/px · 1 of 20 slices shown (12 of 22)]
[im 1/20]
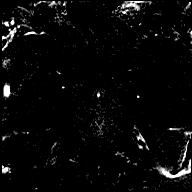

[Series 38: post t1_twist_tra_dyn-copy center · axial · 3.5mm · 0.83mm/px · 1 of 20 slices shown (14 of 23)]
[im 1/20]
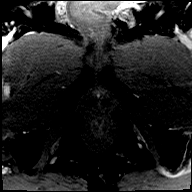

[Series 39: post t1_twist_tra_dyn-copy cent_sub_ttc=(id) · axial · 3.5mm · 0.83mm/px · 1 of 20 slices shown (13 of 22)]
[im 1/20]
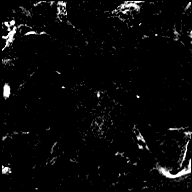

[Series 40: post t1_twist_tra_dyn-copy center · axial · 3.5mm · 0.83mm/px · 1 of 20 slices shown (15 of 23)]
[im 1/20]
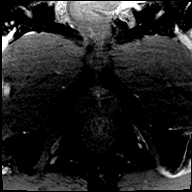

[Series 41: post t1_twist_tra_dyn-copy cent_sub_ttc=(id) · axial · 3.5mm · 0.83mm/px · 1 of 20 slices shown (14 of 22)]
[im 1/20]
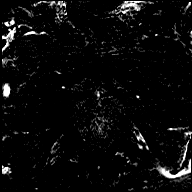

[Series 42: post t1_twist_tra_dyn-copy center · axial · 3.5mm · 0.83mm/px · 1 of 20 slices shown (16 of 23)]
[im 1/20]
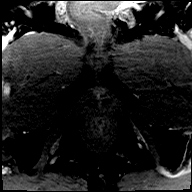

[Series 43: post t1_twist_tra_dyn-copy cent_sub_ttc=(id) · axial · 3.5mm · 0.83mm/px · 1 of 20 slices shown (15 of 22)]
[im 1/20]
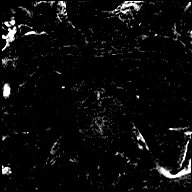

[Series 44: post t1_twist_tra_dyn-copy center · axial · 3.5mm · 0.83mm/px · 1 of 20 slices shown (17 of 23)]
[im 1/20]
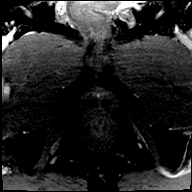

[Series 45: post t1_twist_tra_dyn-copy cent_sub_ttc=(id) · axial · 3.5mm · 0.83mm/px · 1 of 20 slices shown (16 of 22)]
[im 1/20]
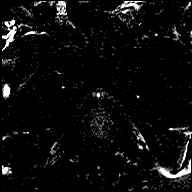

[Series 46: post t1_twist_tra_dyn-copy center · axial · 3.5mm · 0.83mm/px · 1 of 20 slices shown (18 of 23)]
[im 1/20]
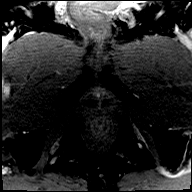

[Series 47: post t1_twist_tra_dyn-copy cent_sub_ttc=(id) · axial · 3.5mm · 0.83mm/px · 1 of 20 slices shown (17 of 22)]
[im 1/20]
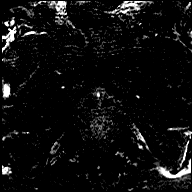

[Series 48: post t1_twist_tra_dyn-copy center · axial · 3.5mm · 0.83mm/px · 1 of 20 slices shown (19 of 23)]
[im 1/20]
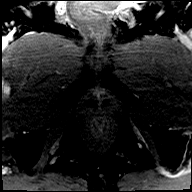

[Series 49: post t1_twist_tra_dyn-copy cent_sub_ttc=(id) · axial · 3.5mm · 0.83mm/px · 1 of 20 slices shown (18 of 22)]
[im 1/20]
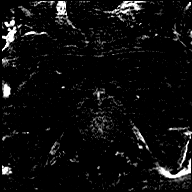

[Series 50: post t1_twist_tra_dyn-copy center · axial · 3.5mm · 0.83mm/px · 1 of 20 slices shown (20 of 23)]
[im 1/20]
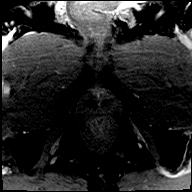

[Series 51: post t1_twist_tra_dyn-copy cent_sub_ttc=(id) · axial · 3.5mm · 0.83mm/px · 1 of 20 slices shown (19 of 22)]
[im 1/20]
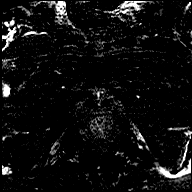

[Series 52: post t1_twist_tra_dyn-copy center · axial · 3.5mm · 0.83mm/px · 1 of 20 slices shown (21 of 23)]
[im 1/20]
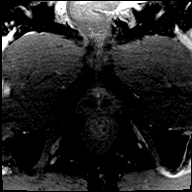

[Series 53: post t1_twist_tra_dyn-copy cent_sub_ttc=(id) · axial · 3.5mm · 0.83mm/px · 1 of 20 slices shown (20 of 22)]
[im 1/20]
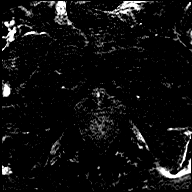

[Series 54: post t1_twist_tra_dyn-copy center · axial · 3.5mm · 0.83mm/px · 1 of 20 slices shown (22 of 23)]
[im 1/20]
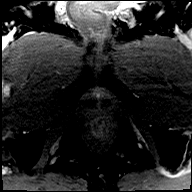

[Series 55: post t1_twist_tra_dyn-copy cent_sub_ttc=(id) · axial · 3.5mm · 0.83mm/px · 1 of 20 slices shown (21 of 22)]
[im 1/20]
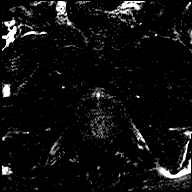

[Series 56: post t1_twist_tra_dyn-copy center · axial · 3.5mm · 0.83mm/px · 1 of 20 slices shown (23 of 23)]
[im 1/20]
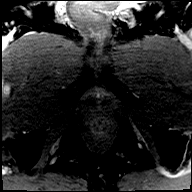

[Series 57: post t1_twist_tra_dyn-copy cent_sub_ttc=(id) · axial · 3.5mm · 0.83mm/px · 1 of 20 slices shown (22 of 22)]
[im 1/20]
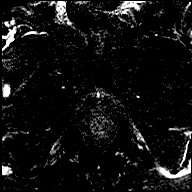

[56 of 56 positions shown; findings below may reference images not displayed]

FINDINGS: Prostate: Within the LEFT mid gland/LEFT lateral mid gland
suspicious lesion again demonstrated which is low signal intensity
on T2 weighted imaging (series 8). On T2 weighted imaging lesion
measures 16 x 8 mm compared to 11 x 6 mm on prior remeasured.

There is a focus of restricted diffusion within this lesion which
enhancement lesion region is similar size to the T2 sequence
measuring 21 by 10 mm (image [DATE]). Lesion is confined within the
prostate capsule.

No additional foci of restricted diffusion in the peripheral zone.
The transitional zone is mildly enlarged with capsulated nodules.

Volume: 4.0 by 3.2 by 5.2 cm (volume = 35 cm^3)

Transcapsular spread:  Absent

Seminal vesicle involvement: Absent

Neurovascular bundle involvement: Absent

Pelvic adenopathy: Absent

Bone metastasis: Absent

Other findings: none
IMPRESSION: 1. Suspicious lesion in the LEFT lateral mid gland increased in size
from 02/27/2018 concerning for high-grade carcinoma ( PI-RADS: 4).
3D post processing (Dynacad) performed
2. No transscapular spread.  Seminal vesicles are normal.
3. No lymphadenopathy.

## 2020-02-23 DIAGNOSIS — H353132 Nonexudative age-related macular degeneration, bilateral, intermediate dry stage: Secondary | ICD-10-CM | POA: Diagnosis not present

## 2020-05-24 DIAGNOSIS — H35423 Microcystoid degeneration of retina, bilateral: Secondary | ICD-10-CM | POA: Diagnosis not present

## 2020-05-24 DIAGNOSIS — H43813 Vitreous degeneration, bilateral: Secondary | ICD-10-CM | POA: Diagnosis not present

## 2020-05-24 DIAGNOSIS — H353132 Nonexudative age-related macular degeneration, bilateral, intermediate dry stage: Secondary | ICD-10-CM | POA: Diagnosis not present

## 2020-05-25 DIAGNOSIS — C61 Malignant neoplasm of prostate: Secondary | ICD-10-CM | POA: Diagnosis not present

## 2020-06-01 DIAGNOSIS — N400 Enlarged prostate without lower urinary tract symptoms: Secondary | ICD-10-CM | POA: Diagnosis not present

## 2020-06-01 DIAGNOSIS — R351 Nocturia: Secondary | ICD-10-CM | POA: Diagnosis not present

## 2020-06-15 ENCOUNTER — Other Ambulatory Visit: Payer: Self-pay

## 2020-06-15 ENCOUNTER — Encounter (HOSPITAL_COMMUNITY): Payer: Self-pay

## 2020-06-15 ENCOUNTER — Emergency Department (HOSPITAL_COMMUNITY)
Admission: EM | Admit: 2020-06-15 | Discharge: 2020-06-16 | Disposition: A | Discharge status: Home / Self Care | Payer: Medicare PPO | Attending: Emergency Medicine | Admitting: Emergency Medicine

## 2020-06-15 DIAGNOSIS — I10 Essential (primary) hypertension: Secondary | ICD-10-CM | POA: Diagnosis not present

## 2020-06-15 DIAGNOSIS — Z79899 Other long term (current) drug therapy: Secondary | ICD-10-CM | POA: Insufficient documentation

## 2020-06-15 DIAGNOSIS — W548XXA Other contact with dog, initial encounter: Secondary | ICD-10-CM | POA: Insufficient documentation

## 2020-06-15 DIAGNOSIS — S0181XA Laceration without foreign body of other part of head, initial encounter: Secondary | ICD-10-CM | POA: Insufficient documentation

## 2020-06-15 DIAGNOSIS — Z87891 Personal history of nicotine dependence: Secondary | ICD-10-CM | POA: Diagnosis not present

## 2020-06-15 DIAGNOSIS — S0083XA Contusion of other part of head, initial encounter: Secondary | ICD-10-CM

## 2020-06-15 DIAGNOSIS — S0990XA Unspecified injury of head, initial encounter: Secondary | ICD-10-CM | POA: Diagnosis present

## 2020-06-15 DIAGNOSIS — Z8546 Personal history of malignant neoplasm of prostate: Secondary | ICD-10-CM | POA: Diagnosis not present

## 2020-06-15 NOTE — ED Triage Notes (Signed)
Pt pulled into door by dog. Approx 3cm laceration on forehead.

## 2020-06-16 MED ORDER — LIDOCAINE-EPINEPHRINE 2 %-1:100000 IJ SOLN
20.0000 mL | Freq: Once | INTRAMUSCULAR | Status: AC
  Administered 2020-06-16: 20 mL
  Filled 2020-06-16: qty 1

## 2020-06-16 NOTE — ED Notes (Signed)
6 sutures placed by Dr. Florina Ou. Instructed to get removed in 5 days.

## 2020-06-16 NOTE — ED Provider Notes (Signed)
[place] DEPT Provider Note: Georgena Spurling, MD, FACEP  CSN: [id number] MRN: [id number] ARRIVAL: 06/15/20 at 2211 ROOM: [place]  Facial Laceration   HISTORY OF PRESENT ILLNESS  06/16/20 12:01 AM Jeremy Caldwell is a 74 y.o. male who is beagle pulled him into a door frame when the beagle was attempting to chase a fox.  He has a laceration to his mid forehead.  He rates associated pain is a 6 out of 10.  Bleeding is controlled.  There was no loss of consciousness.  He has not been vomiting.  Tetanus is up-to-date.   Past Medical History:  Diagnosis Date  . Hypertension   . Prostate cancer [facility])     Past Surgical History:  Procedure Laterality Date  . PROSTATE BIOPSY      Family History  Problem Relation Age of Onset  . Cancer Mother        male ca  . Cancer Father        either prostate or colon    Social History   Tobacco Use  . Smoking status: Former Smoker    Packs/day: 1.00    Years: 15.00    Pack years: 15.00    Types: Cigarettes    Quit date: 08/14/1976    Years since quitting: 43.8  . Smokeless tobacco: Never Used  Vaping Use  . Vaping Use: Never used  Substance Use Topics  . Alcohol use: Not Currently  . Drug use: Never    Prior to Admission medications   Medication Sig Start Date End Date Taking? Authorizing Provider  fluticasone (FLONASE) 50 MCG/ACT nasal spray Place 2 sprays into the nose daily. Patient not taking: Reported on 03/25/2019 03/25/13   Vernie Shanks, MD  Multiple Vitamin (MULTIVITAMIN) tablet Take 1 tablet by mouth daily.    [provider]  Multiple Vitamins-Minerals (OCUVITE PRESERVISION PO) Take by mouth.    [provider]  NIFEdipine (PROCARDIA-XL/ADALAT CC) 30 MG 24 hr tablet TAKE 1 TABLET BY MOUTH DAILY 05/19/13   Vernie Shanks, MD  tamsulosin (FLOMAX) 0.4 MG CAPS capsule Take 1 capsule (0.4 mg total) by mouth daily after supper. 05/09/19   Tyler Pita, MD     Allergies Propofol   REVIEW OF SYSTEMS  Negative except as noted here or in the History of Present Illness.   PHYSICAL EXAMINATION  Initial Vital Signs Blood pressure (!) 178/96, pulse (!) 56, temperature 98.3 F (36.8 C), temperature source Oral, resp. rate 14, SpO2 99 %.  Examination General: Well-developed, well-nourished male in no acute distress; appearance consistent with age of record HENT: normocephalic; hearing aids; laceration mid forehead:    Eyes: pupils equal, round and reactive to light; extraocular muscles intact Neck: supple; nontender Heart: regular rate and rhythm Lungs: clear to auscultation bilaterally Abdomen: soft; nondistended; nontender; bowel sounds present Extremities: No deformity; full range of motion Neurologic: Awake, alert and oriented; motor function intact in all extremities and symmetric; no facial droop Skin: Warm and dry Psychiatric: Normal mood and affect   RESULTS  Summary of this visit's results, reviewed and interpreted by myself:   EKG Interpretation  Date/Time:    Ventricular Rate:    PR Interval:    QRS Duration:   QT Interval:    QTC Calculation:   R Axis:     Text Interpretation:        Laboratory Studies: No results found for this or any previous visit (from the past 24 hour(s)). Imaging Studies:  No results found.  ED COURSE and MDM  Nursing notes, initial and subsequent vitals signs, including pulse oximetry, reviewed and interpreted by myself.  Vitals:   06/15/20 2241  BP: (!) 178/96  Pulse: (!) 56  Resp: 14  Temp: 98.3 F (36.8 C)  TempSrc: Oral  SpO2: 99%   Medications  lidocaine-EPINEPHrine (XYLOCAINE W/EPI) 2 %-1:100000 (with pres) injection 20 mL (20 mLs Infiltration Given by Other 06/16/20 0007)      PROCEDURES  Procedures  LACERATION REPAIR Performed by: Karen Chafe Esker Dever Authorized by: Karen Chafe Taunya Goral Consent: Verbal consent obtained. Risks and benefits: risks, benefits and alternatives  were discussed Consent given by: patient Patient identity confirmed: provided demographic data Prepped and Draped in normal sterile fashion Wound explored  Laceration Location: mid-forehead  Laceration Length: 3 cm  No Foreign Bodies seen or palpated  Anesthesia: local infiltration  Local anesthetic: lidocaine 2% with epinephrine  Anesthetic total: 3 ml  Irrigation method: syringe Amount of cleaning: standard  Skin closure: 5-0 Prolene  Number of sutures: 6  Technique: simple interrupted  Patient tolerance: Patient tolerated the procedure well with no immediate complications.    ED DIAGNOSES     ICD-10-CM   1. Laceration of forehead, initial encounter  S01.81XA   2. Contusion of forehead, initial encounter  S00.83XA   3. Other contact with dog, initial encounter  W54.8XXA        Shylo Zamor, Jenny Reichmann, MD 06/16/20 0201

## 2020-06-21 DIAGNOSIS — S0181XD Laceration without foreign body of other part of head, subsequent encounter: Secondary | ICD-10-CM | POA: Diagnosis not present

## 2020-06-21 DIAGNOSIS — Z6825 Body mass index (BMI) 25.0-25.9, adult: Secondary | ICD-10-CM | POA: Diagnosis not present

## 2020-11-25 DIAGNOSIS — H35423 Microcystoid degeneration of retina, bilateral: Secondary | ICD-10-CM | POA: Diagnosis not present

## 2020-11-25 DIAGNOSIS — H43813 Vitreous degeneration, bilateral: Secondary | ICD-10-CM | POA: Diagnosis not present

## 2020-11-25 DIAGNOSIS — H353132 Nonexudative age-related macular degeneration, bilateral, intermediate dry stage: Secondary | ICD-10-CM | POA: Diagnosis not present

## 2020-11-25 DIAGNOSIS — H35372 Puckering of macula, left eye: Secondary | ICD-10-CM | POA: Diagnosis not present

## 2020-12-14 DIAGNOSIS — C44529 Squamous cell carcinoma of skin of other part of trunk: Secondary | ICD-10-CM | POA: Diagnosis not present

## 2020-12-14 DIAGNOSIS — D229 Melanocytic nevi, unspecified: Secondary | ICD-10-CM | POA: Diagnosis not present

## 2020-12-14 DIAGNOSIS — B351 Tinea unguium: Secondary | ICD-10-CM | POA: Diagnosis not present

## 2020-12-14 DIAGNOSIS — L905 Scar conditions and fibrosis of skin: Secondary | ICD-10-CM | POA: Diagnosis not present

## 2020-12-14 DIAGNOSIS — L821 Other seborrheic keratosis: Secondary | ICD-10-CM | POA: Diagnosis not present

## 2020-12-14 DIAGNOSIS — L819 Disorder of pigmentation, unspecified: Secondary | ICD-10-CM | POA: Diagnosis not present

## 2020-12-14 DIAGNOSIS — L812 Freckles: Secondary | ICD-10-CM | POA: Diagnosis not present

## 2020-12-14 DIAGNOSIS — B070 Plantar wart: Secondary | ICD-10-CM | POA: Diagnosis not present

## 2020-12-14 DIAGNOSIS — D045 Carcinoma in situ of skin of trunk: Secondary | ICD-10-CM | POA: Diagnosis not present

## 2020-12-14 DIAGNOSIS — L570 Actinic keratosis: Secondary | ICD-10-CM | POA: Diagnosis not present

## 2020-12-27 DIAGNOSIS — E785 Hyperlipidemia, unspecified: Secondary | ICD-10-CM | POA: Diagnosis not present

## 2020-12-27 DIAGNOSIS — Z1211 Encounter for screening for malignant neoplasm of colon: Secondary | ICD-10-CM | POA: Diagnosis not present

## 2020-12-27 DIAGNOSIS — I10 Essential (primary) hypertension: Secondary | ICD-10-CM | POA: Diagnosis not present

## 2020-12-27 DIAGNOSIS — C61 Malignant neoplasm of prostate: Secondary | ICD-10-CM | POA: Diagnosis not present

## 2020-12-27 DIAGNOSIS — E559 Vitamin D deficiency, unspecified: Secondary | ICD-10-CM | POA: Diagnosis not present

## 2020-12-27 DIAGNOSIS — D696 Thrombocytopenia, unspecified: Secondary | ICD-10-CM | POA: Diagnosis not present

## 2021-01-07 DIAGNOSIS — E785 Hyperlipidemia, unspecified: Secondary | ICD-10-CM | POA: Diagnosis not present

## 2021-01-07 DIAGNOSIS — D696 Thrombocytopenia, unspecified: Secondary | ICD-10-CM | POA: Diagnosis not present

## 2021-01-07 DIAGNOSIS — I10 Essential (primary) hypertension: Secondary | ICD-10-CM | POA: Diagnosis not present

## 2021-01-07 DIAGNOSIS — Z0000 Encounter for general adult medical examination without abnormal findings: Secondary | ICD-10-CM | POA: Diagnosis not present

## 2021-01-07 DIAGNOSIS — E559 Vitamin D deficiency, unspecified: Secondary | ICD-10-CM | POA: Diagnosis not present

## 2021-01-07 DIAGNOSIS — Z1211 Encounter for screening for malignant neoplasm of colon: Secondary | ICD-10-CM | POA: Diagnosis not present

## 2021-01-11 DIAGNOSIS — C44519 Basal cell carcinoma of skin of other part of trunk: Secondary | ICD-10-CM | POA: Diagnosis not present

## 2021-01-31 DIAGNOSIS — Z1211 Encounter for screening for malignant neoplasm of colon: Secondary | ICD-10-CM | POA: Diagnosis not present

## 2021-03-14 DIAGNOSIS — L814 Other melanin hyperpigmentation: Secondary | ICD-10-CM | POA: Diagnosis not present

## 2021-03-14 DIAGNOSIS — L905 Scar conditions and fibrosis of skin: Secondary | ICD-10-CM | POA: Diagnosis not present

## 2021-03-14 DIAGNOSIS — L821 Other seborrheic keratosis: Secondary | ICD-10-CM | POA: Diagnosis not present

## 2021-03-14 DIAGNOSIS — Z85828 Personal history of other malignant neoplasm of skin: Secondary | ICD-10-CM | POA: Diagnosis not present

## 2021-03-14 DIAGNOSIS — L819 Disorder of pigmentation, unspecified: Secondary | ICD-10-CM | POA: Diagnosis not present

## 2021-05-26 DIAGNOSIS — H35373 Puckering of macula, bilateral: Secondary | ICD-10-CM | POA: Diagnosis not present

## 2021-05-26 DIAGNOSIS — H35423 Microcystoid degeneration of retina, bilateral: Secondary | ICD-10-CM | POA: Diagnosis not present

## 2021-05-26 DIAGNOSIS — H353132 Nonexudative age-related macular degeneration, bilateral, intermediate dry stage: Secondary | ICD-10-CM | POA: Diagnosis not present

## 2021-05-26 DIAGNOSIS — C61 Malignant neoplasm of prostate: Secondary | ICD-10-CM | POA: Diagnosis not present

## 2021-05-26 DIAGNOSIS — H43813 Vitreous degeneration, bilateral: Secondary | ICD-10-CM | POA: Diagnosis not present

## 2021-06-02 DIAGNOSIS — R972 Elevated prostate specific antigen [PSA]: Secondary | ICD-10-CM | POA: Diagnosis not present

## 2021-06-02 DIAGNOSIS — N400 Enlarged prostate without lower urinary tract symptoms: Secondary | ICD-10-CM | POA: Diagnosis not present

## 2021-09-15 DIAGNOSIS — B078 Other viral warts: Secondary | ICD-10-CM | POA: Diagnosis not present

## 2021-09-15 DIAGNOSIS — L821 Other seborrheic keratosis: Secondary | ICD-10-CM | POA: Diagnosis not present

## 2021-09-15 DIAGNOSIS — L568 Other specified acute skin changes due to ultraviolet radiation: Secondary | ICD-10-CM | POA: Diagnosis not present

## 2021-09-15 DIAGNOSIS — D225 Melanocytic nevi of trunk: Secondary | ICD-10-CM | POA: Diagnosis not present

## 2021-09-15 DIAGNOSIS — L814 Other melanin hyperpigmentation: Secondary | ICD-10-CM | POA: Diagnosis not present

## 2021-09-15 DIAGNOSIS — L570 Actinic keratosis: Secondary | ICD-10-CM | POA: Diagnosis not present

## 2021-11-24 DIAGNOSIS — H35423 Microcystoid degeneration of retina, bilateral: Secondary | ICD-10-CM | POA: Diagnosis not present

## 2021-11-24 DIAGNOSIS — H35373 Puckering of macula, bilateral: Secondary | ICD-10-CM | POA: Diagnosis not present

## 2021-11-24 DIAGNOSIS — H43813 Vitreous degeneration, bilateral: Secondary | ICD-10-CM | POA: Diagnosis not present

## 2021-11-24 DIAGNOSIS — H353132 Nonexudative age-related macular degeneration, bilateral, intermediate dry stage: Secondary | ICD-10-CM | POA: Diagnosis not present

## 2022-01-11 DIAGNOSIS — Z1211 Encounter for screening for malignant neoplasm of colon: Secondary | ICD-10-CM | POA: Diagnosis not present

## 2022-01-11 DIAGNOSIS — Z8546 Personal history of malignant neoplasm of prostate: Secondary | ICD-10-CM | POA: Diagnosis not present

## 2022-01-11 DIAGNOSIS — E785 Hyperlipidemia, unspecified: Secondary | ICD-10-CM | POA: Diagnosis not present

## 2022-01-11 DIAGNOSIS — E559 Vitamin D deficiency, unspecified: Secondary | ICD-10-CM | POA: Diagnosis not present

## 2022-01-11 DIAGNOSIS — Z125 Encounter for screening for malignant neoplasm of prostate: Secondary | ICD-10-CM | POA: Diagnosis not present

## 2022-01-17 DIAGNOSIS — Z0000 Encounter for general adult medical examination without abnormal findings: Secondary | ICD-10-CM | POA: Diagnosis not present

## 2022-01-17 DIAGNOSIS — I10 Essential (primary) hypertension: Secondary | ICD-10-CM | POA: Diagnosis not present

## 2022-01-17 DIAGNOSIS — E785 Hyperlipidemia, unspecified: Secondary | ICD-10-CM | POA: Diagnosis not present

## 2022-01-17 DIAGNOSIS — D696 Thrombocytopenia, unspecified: Secondary | ICD-10-CM | POA: Diagnosis not present

## 2022-01-17 DIAGNOSIS — Z1211 Encounter for screening for malignant neoplasm of colon: Secondary | ICD-10-CM | POA: Diagnosis not present

## 2022-01-17 DIAGNOSIS — E559 Vitamin D deficiency, unspecified: Secondary | ICD-10-CM | POA: Diagnosis not present

## 2023-06-11 DIAGNOSIS — D224 Melanocytic nevi of scalp and neck: Secondary | ICD-10-CM | POA: Diagnosis not present

## 2023-06-11 DIAGNOSIS — L570 Actinic keratosis: Secondary | ICD-10-CM | POA: Diagnosis not present

## 2023-06-19 DIAGNOSIS — R972 Elevated prostate specific antigen [PSA]: Secondary | ICD-10-CM | POA: Diagnosis not present

## 2023-06-26 DIAGNOSIS — N400 Enlarged prostate without lower urinary tract symptoms: Secondary | ICD-10-CM | POA: Diagnosis not present

## 2023-06-26 DIAGNOSIS — R972 Elevated prostate specific antigen [PSA]: Secondary | ICD-10-CM | POA: Diagnosis not present

## 2023-07-04 DIAGNOSIS — I10 Essential (primary) hypertension: Secondary | ICD-10-CM | POA: Diagnosis not present

## 2023-07-04 DIAGNOSIS — T50Z95A Adverse effect of other vaccines and biological substances, initial encounter: Secondary | ICD-10-CM | POA: Diagnosis not present

## 2023-07-04 DIAGNOSIS — Z6825 Body mass index (BMI) 25.0-25.9, adult: Secondary | ICD-10-CM | POA: Diagnosis not present

## 2023-07-16 DIAGNOSIS — I10 Essential (primary) hypertension: Secondary | ICD-10-CM | POA: Diagnosis not present

## 2023-09-10 DIAGNOSIS — H35033 Hypertensive retinopathy, bilateral: Secondary | ICD-10-CM | POA: Diagnosis not present

## 2023-09-10 DIAGNOSIS — H43813 Vitreous degeneration, bilateral: Secondary | ICD-10-CM | POA: Diagnosis not present

## 2023-09-10 DIAGNOSIS — H353132 Nonexudative age-related macular degeneration, bilateral, intermediate dry stage: Secondary | ICD-10-CM | POA: Diagnosis not present

## 2023-09-10 DIAGNOSIS — H35373 Puckering of macula, bilateral: Secondary | ICD-10-CM | POA: Diagnosis not present

## 2023-09-10 DIAGNOSIS — H35423 Microcystoid degeneration of retina, bilateral: Secondary | ICD-10-CM | POA: Diagnosis not present

## 2023-12-10 DIAGNOSIS — L821 Other seborrheic keratosis: Secondary | ICD-10-CM | POA: Diagnosis not present

## 2023-12-10 DIAGNOSIS — L814 Other melanin hyperpigmentation: Secondary | ICD-10-CM | POA: Diagnosis not present

## 2023-12-10 DIAGNOSIS — D225 Melanocytic nevi of trunk: Secondary | ICD-10-CM | POA: Diagnosis not present

## 2023-12-10 DIAGNOSIS — L570 Actinic keratosis: Secondary | ICD-10-CM | POA: Diagnosis not present

## 2024-02-04 DIAGNOSIS — D696 Thrombocytopenia, unspecified: Secondary | ICD-10-CM | POA: Diagnosis not present

## 2024-02-04 DIAGNOSIS — I10 Essential (primary) hypertension: Secondary | ICD-10-CM | POA: Diagnosis not present

## 2024-02-04 DIAGNOSIS — E559 Vitamin D deficiency, unspecified: Secondary | ICD-10-CM | POA: Diagnosis not present

## 2024-02-04 DIAGNOSIS — E785 Hyperlipidemia, unspecified: Secondary | ICD-10-CM | POA: Diagnosis not present

## 2024-02-04 DIAGNOSIS — Z8546 Personal history of malignant neoplasm of prostate: Secondary | ICD-10-CM | POA: Diagnosis not present

## 2024-02-05 DIAGNOSIS — E559 Vitamin D deficiency, unspecified: Secondary | ICD-10-CM | POA: Diagnosis not present

## 2024-02-05 DIAGNOSIS — Z0000 Encounter for general adult medical examination without abnormal findings: Secondary | ICD-10-CM | POA: Diagnosis not present

## 2024-02-05 DIAGNOSIS — Z8546 Personal history of malignant neoplasm of prostate: Secondary | ICD-10-CM | POA: Diagnosis not present

## 2024-02-05 DIAGNOSIS — E785 Hyperlipidemia, unspecified: Secondary | ICD-10-CM | POA: Diagnosis not present

## 2024-02-05 DIAGNOSIS — D696 Thrombocytopenia, unspecified: Secondary | ICD-10-CM | POA: Diagnosis not present

## 2024-02-05 DIAGNOSIS — Z1331 Encounter for screening for depression: Secondary | ICD-10-CM | POA: Diagnosis not present

## 2024-02-05 DIAGNOSIS — I10 Essential (primary) hypertension: Secondary | ICD-10-CM | POA: Diagnosis not present

## 2024-02-28 DIAGNOSIS — L538 Other specified erythematous conditions: Secondary | ICD-10-CM | POA: Diagnosis not present

## 2024-02-28 DIAGNOSIS — D224 Melanocytic nevi of scalp and neck: Secondary | ICD-10-CM | POA: Diagnosis not present

## 2024-02-28 DIAGNOSIS — D492 Neoplasm of unspecified behavior of bone, soft tissue, and skin: Secondary | ICD-10-CM | POA: Diagnosis not present

## 2024-02-28 DIAGNOSIS — L821 Other seborrheic keratosis: Secondary | ICD-10-CM | POA: Diagnosis not present

## 2024-03-10 DIAGNOSIS — H43391 Other vitreous opacities, right eye: Secondary | ICD-10-CM | POA: Diagnosis not present

## 2024-03-10 DIAGNOSIS — H43813 Vitreous degeneration, bilateral: Secondary | ICD-10-CM | POA: Diagnosis not present

## 2024-03-10 DIAGNOSIS — H35373 Puckering of macula, bilateral: Secondary | ICD-10-CM | POA: Diagnosis not present

## 2024-03-10 DIAGNOSIS — H35033 Hypertensive retinopathy, bilateral: Secondary | ICD-10-CM | POA: Diagnosis not present

## 2024-03-10 DIAGNOSIS — H35423 Microcystoid degeneration of retina, bilateral: Secondary | ICD-10-CM | POA: Diagnosis not present

## 2024-03-10 DIAGNOSIS — H353132 Nonexudative age-related macular degeneration, bilateral, intermediate dry stage: Secondary | ICD-10-CM | POA: Diagnosis not present

## 2024-04-08 DIAGNOSIS — L821 Other seborrheic keratosis: Secondary | ICD-10-CM | POA: Diagnosis not present

## 2024-06-18 DIAGNOSIS — R972 Elevated prostate specific antigen [PSA]: Secondary | ICD-10-CM | POA: Diagnosis not present

## 2024-06-25 DIAGNOSIS — R351 Nocturia: Secondary | ICD-10-CM | POA: Diagnosis not present

## 2024-06-25 DIAGNOSIS — N3281 Overactive bladder: Secondary | ICD-10-CM | POA: Diagnosis not present

## 2024-06-25 DIAGNOSIS — R350 Frequency of micturition: Secondary | ICD-10-CM | POA: Diagnosis not present

## 2024-06-25 DIAGNOSIS — R399 Unspecified symptoms and signs involving the genitourinary system: Secondary | ICD-10-CM | POA: Diagnosis not present
